# Patient Record
Sex: Male | Born: 1960 | Race: White | Hispanic: No | Marital: Married | Smoking: Never smoker
Health system: Southern US, Community
[De-identification: ages and names within clinical notes are randomized; demographics above are authoritative.]

## PROBLEM LIST (undated history)

## (undated) DIAGNOSIS — Z973 Presence of spectacles and contact lenses: Secondary | ICD-10-CM

## (undated) DIAGNOSIS — Z9989 Dependence on other enabling machines and devices: Secondary | ICD-10-CM

## (undated) DIAGNOSIS — R7303 Prediabetes: Secondary | ICD-10-CM

## (undated) DIAGNOSIS — Z87898 Personal history of other specified conditions: Secondary | ICD-10-CM

## (undated) DIAGNOSIS — I10 Essential (primary) hypertension: Secondary | ICD-10-CM

## (undated) DIAGNOSIS — Z8669 Personal history of other diseases of the nervous system and sense organs: Secondary | ICD-10-CM

## (undated) DIAGNOSIS — G4733 Obstructive sleep apnea (adult) (pediatric): Secondary | ICD-10-CM

## (undated) DIAGNOSIS — C61 Malignant neoplasm of prostate: Secondary | ICD-10-CM

## (undated) HISTORY — DX: Essential (primary) hypertension: I10

## (undated) HISTORY — PX: TONSILLECTOMY: SUR1361

## (undated) HISTORY — PX: PROSTATE BIOPSY: SHX241

---

## 2016-11-08 ENCOUNTER — Other Ambulatory Visit: Payer: Self-pay | Admitting: Urology

## 2016-11-08 DIAGNOSIS — C61 Malignant neoplasm of prostate: Secondary | ICD-10-CM

## 2016-11-10 ENCOUNTER — Encounter (INDEPENDENT_AMBULATORY_CARE_PROVIDER_SITE_OTHER): Payer: Self-pay

## 2016-11-10 ENCOUNTER — Encounter: Payer: Self-pay | Admitting: Cardiology

## 2016-11-10 ENCOUNTER — Ambulatory Visit (INDEPENDENT_AMBULATORY_CARE_PROVIDER_SITE_OTHER): Payer: Managed Care, Other (non HMO) | Admitting: Cardiology

## 2016-11-10 VITALS — BP 112/72 | HR 54 | Ht 71.0 in | Wt 220.2 lb

## 2016-11-10 DIAGNOSIS — I10 Essential (primary) hypertension: Secondary | ICD-10-CM | POA: Diagnosis not present

## 2016-11-10 DIAGNOSIS — R55 Syncope and collapse: Secondary | ICD-10-CM | POA: Diagnosis not present

## 2016-11-10 NOTE — Patient Instructions (Signed)
Medication Instructions:  Your physician recommends that you continue on your current medications as directed. Please refer to the Current Medication list given to you today.  * If you need a refill on your cardiac medications before your next appointment, please call your pharmacy.   Labwork: None ordered  Testing/Procedures: None ordered  Follow-Up: No follow up is needed at this time with Dr. Camnitz.  He will see you on an as needed basis.   Thank you for choosing [facility]!!   Zymier Rodgers, RN [phone]     
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
## 2017-01-28 ENCOUNTER — Ambulatory Visit (HOSPITAL_COMMUNITY)
Admission: RE | Admit: 2017-01-28 | Discharge: 2017-01-28 | Disposition: A | Discharge status: Home / Self Care | Payer: Managed Care, Other (non HMO) | Source: Ambulatory Visit | Attending: Urology | Admitting: Urology

## 2017-02-04 ENCOUNTER — Other Ambulatory Visit (HOSPITAL_COMMUNITY): Payer: Self-pay

## 2017-02-08 ENCOUNTER — Ambulatory Visit (HOSPITAL_COMMUNITY)
Admission: RE | Admit: 2017-02-08 | Discharge: 2017-02-08 | Disposition: A | Discharge status: Home / Self Care | Payer: Managed Care, Other (non HMO) | Source: Ambulatory Visit | Attending: Urology | Admitting: Urology

## 2017-02-08 DIAGNOSIS — C61 Malignant neoplasm of prostate: Secondary | ICD-10-CM | POA: Insufficient documentation

## 2017-02-08 LAB — POCT I-STAT CREATININE: Creatinine, Ser: 1 mg/dL (ref 0.61–1.24)

## 2017-02-08 MED ORDER — LIDOCAINE HCL 2 % EX GEL
CUTANEOUS | Status: AC
  Filled 2017-02-08: qty 30

## 2017-02-08 MED ORDER — GADOBENATE DIMEGLUMINE 529 MG/ML IV SOLN
20.0000 mL | Freq: Once | INTRAVENOUS | Status: AC | PRN
  Administered 2017-02-08: 19 mL via INTRAVENOUS

## 2017-02-21 HISTORY — PX: COLONOSCOPY: SHX174

## 2017-03-16 ENCOUNTER — Encounter: Payer: Self-pay | Admitting: Radiation Oncology

## 2017-03-29 ENCOUNTER — Encounter: Payer: Self-pay | Admitting: Radiation Oncology

## 2017-03-29 DIAGNOSIS — C61 Malignant neoplasm of prostate: Secondary | ICD-10-CM | POA: Insufficient documentation

## 2017-03-29 NOTE — Progress Notes (Signed)
GU Location of Tumor / Histology: prostatic adenocarcinoma  If Prostate Cancer, Gleason Score is (3 + 4) and PSA is (6.85) on 02/08/17. Prostate volume: 25 grams.  Sean Doyle was diagnosed with prostate cancer 03/04/2016. At the time of diagnosis his PSA was 5.53.   Biopsies of prostate (second) (if applicable) revealed:    Past/Anticipated interventions by urology, if any: biopsy, surveillance biopsy, referral to radiation oncology  Past/Anticipated interventions by medical oncology, if any: no  Weight changes, if any: no  Bowel/Bladder complaints, if any: no   Nausea/Vomiting, if any: no  Pain issues, if any:  no  SAFETY ISSUES:  Prior radiation? no  Pacemaker/ICD? no  Possible current pregnancy? no  Is the patient on methotrexate? no  Current Complaints / other details:  56 year old male. Married. Father with hx of prostate ca.

## 2017-03-30 ENCOUNTER — Other Ambulatory Visit: Payer: Self-pay | Admitting: Urology

## 2017-03-30 ENCOUNTER — Ambulatory Visit
Admission: RE | Admit: 2017-03-30 | Discharge: 2017-03-30 | Disposition: A | Discharge status: Home / Self Care | Payer: Managed Care, Other (non HMO) | Source: Ambulatory Visit | Attending: Radiation Oncology | Admitting: Radiation Oncology

## 2017-03-30 ENCOUNTER — Other Ambulatory Visit: Payer: Self-pay

## 2017-03-30 ENCOUNTER — Encounter: Payer: Self-pay | Admitting: Radiation Oncology

## 2017-03-30 ENCOUNTER — Telehealth: Payer: Self-pay | Admitting: *Deleted

## 2017-03-30 VITALS — BP 128/80 | HR 64 | Temp 98.5°F | Wt 228.2 lb

## 2017-03-30 DIAGNOSIS — Z8249 Family history of ischemic heart disease and other diseases of the circulatory system: Secondary | ICD-10-CM | POA: Diagnosis not present

## 2017-03-30 DIAGNOSIS — Z8042 Family history of malignant neoplasm of prostate: Secondary | ICD-10-CM | POA: Diagnosis not present

## 2017-03-30 DIAGNOSIS — Z510 Encounter for antineoplastic radiation therapy: Secondary | ICD-10-CM | POA: Diagnosis not present

## 2017-03-30 DIAGNOSIS — C61 Malignant neoplasm of prostate: Secondary | ICD-10-CM | POA: Insufficient documentation

## 2017-03-30 DIAGNOSIS — I10 Essential (primary) hypertension: Secondary | ICD-10-CM | POA: Insufficient documentation

## 2017-03-30 DIAGNOSIS — Z823 Family history of stroke: Secondary | ICD-10-CM | POA: Insufficient documentation

## 2017-03-30 HISTORY — DX: Malignant neoplasm of prostate: C61
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
## 2017-03-30 NOTE — Telephone Encounter (Signed)
CALLED PATIENT TO INFORM OF PRE-SEED APPTS. FOR 04-08-17, LVM FOR A RETURN CALL

## 2017-03-30 NOTE — Progress Notes (Signed)
GU Location of Tumor / Histology: prostatic adenocarcinoma  If Prostate Cancer, Gleason Score is (3 + 4) and PSA is (6.85) on 02/08/17. Prostate volume: 25 grams.  Trice Aspinall was diagnosed with prostate cancer 03/04/2016. At the time of diagnosis his PSA was 5.53.   Biopsies of prostate (second) (if applicable) revealed:    Past/Anticipated interventions by urology, if any: biopsy, surveillance biopsy, referral to radiation oncology  Past/Anticipated interventions by medical oncology, if any: no  Weight changes, if any: no  Bowel/Bladder complaints, if any: No  Nausea/Vomiting, if any: no  Pain issues, if any:  No  SAFETY ISSUES:  Prior radiation? No  Pacemaker/ICD? No  Possible current pregnancy? no  Is the patient on methotrexate? No  Current Complaints / other details:  56 year old male. Married. Father with hx of prostate ca.  BP 128/80   Pulse 64   Temp 98.5 F (36.9 C) (Oral)   Wt 228 lb 3.2 oz (103.5 kg)   SpO2 98%   BMI 31.83 kg/m   Wt Readings from Last 3 Encounters:  03/30/17 228 lb 3.2 oz (103.5 kg)  11/10/16 220 lb 3.2 oz (99.9 kg)

## 2017-03-31 ENCOUNTER — Other Ambulatory Visit: Payer: Self-pay | Admitting: Urology

## 2017-04-05 ENCOUNTER — Telehealth: Payer: Self-pay | Admitting: *Deleted

## 2017-04-05 NOTE — Telephone Encounter (Signed)
CALLED PATIENT TO INFORM THAT  PRE- SEED APPTS. HAS BEEN MOVED ON 04-08-17 TO 2:30 PM FOR PRE-SEED CT AND 2:45 PM FOR FU VISIT, LVM FOR A RETURN CALL

## 2017-04-07 ENCOUNTER — Telehealth: Payer: Self-pay | Admitting: *Deleted

## 2017-04-07 ENCOUNTER — Telehealth: Payer: Self-pay | Admitting: Medical Oncology

## 2017-04-07 NOTE — Telephone Encounter (Signed)
CALLED PATIENT TO REMIND OF PRE-SEED APPTS. FOR 04-08-17, SPOKE WITH PATIENT AND HE IS AWARE OF THESE APPTS.

## 2017-04-07 NOTE — Telephone Encounter (Signed)
Called to introduce myself to Sean Doyle as the prostate nurse navigator and my role. I was unable to meet him the day he consulted with Dr. Tammi Klippel. He states that he will have seed implant 05/12/17 and is scheduled for CT simulation tomorrow. I will continue to follow and asked him to call with questions or concerns.

## 2017-04-08 ENCOUNTER — Encounter: Payer: Self-pay | Admitting: Medical Oncology

## 2017-04-08 ENCOUNTER — Inpatient Hospital Stay
Admission: RE | Admit: 2017-04-08 | Discharge: 2017-04-08 | Disposition: A | Discharge status: Home / Self Care | Payer: Managed Care, Other (non HMO) | Source: Ambulatory Visit | Attending: Radiation Oncology | Admitting: Radiation Oncology

## 2017-04-08 ENCOUNTER — Ambulatory Visit
Admission: RE | Admit: 2017-04-08 | Discharge: 2017-04-08 | Disposition: A | Discharge status: Home / Self Care | Payer: Managed Care, Other (non HMO) | Source: Ambulatory Visit | Attending: Radiation Oncology | Admitting: Radiation Oncology

## 2017-04-08 DIAGNOSIS — C61 Malignant neoplasm of prostate: Secondary | ICD-10-CM

## 2017-04-08 DIAGNOSIS — Z510 Encounter for antineoplastic radiation therapy: Secondary | ICD-10-CM | POA: Diagnosis not present

## 2017-04-08 NOTE — Progress Notes (Signed)
  Radiation Oncology         [phone]) [phone] ________________________________  Name: Sean Doyle MRN: [id number]  Date: 04/08/2017  DOB: April 18, 1961  SIMULATION AND TREATMENT PLANNING NOTE PUBIC ARCH STUDY  [id number], Abigail Butts, MD  Irine Seal, MD  DIAGNOSIS: 56 y.o. gentleman with Stage T1c adenocarcinoma of the prostate with Gleason Score of 3+4, and PSA of 6.85    ICD-10-CM   1. Malignant neoplasm of prostate ([place]) C61     COMPLEX SIMULATION:  The patient presented today for evaluation for possible prostate seed implant. He was brought to the radiation planning suite and placed supine on the CT couch. A 3-dimensional image study set was obtained in upload to the planning computer. There, on each axial slice, I contoured the prostate gland. Then, using three-dimensional radiation planning tools I reconstructed the prostate in view of the structures from the transperineal needle pathway to assess for possible pubic arch interference. In doing so, I did not appreciate any pubic arch interference. Also, the patient's prostate volume was estimated based on the drawn structure. The volume was 34 cc.  Given the pubic arch appearance and prostate volume, patient remains a good candidate to proceed with prostate seed implant. Today, he freely provided informed written consent to proceed.    PLAN: The patient will undergo prostate seed implant.   ________________________________  Sheral Apley. Tammi Klippel, M.D.  This document serves as a record of services personally performed by Tyler Pita, MD. It was created on his behalf by Rae Lips, a trained medical scribe. The creation of this record is based on the scribe's personal observations and the provider's statements to them. This document has been checked and approved by the attending provider.

## 2017-05-04 ENCOUNTER — Telehealth: Payer: Self-pay | Admitting: *Deleted

## 2017-05-04 NOTE — Telephone Encounter (Signed)
Called patient to remind of labs for procedure on 05-12-17, lab appt. On 05-05-17- arrival time -1:45 pm for 2 pm lab, pt. to report to [facility], spoke with patient and he is aware of this appt.

## 2017-05-05 ENCOUNTER — Encounter (HOSPITAL_COMMUNITY)
Admission: RE | Admit: 2017-05-05 | Discharge: 2017-05-05 | Disposition: A | Discharge status: Home / Self Care | Payer: Managed Care, Other (non HMO) | Source: Ambulatory Visit | Attending: Urology | Admitting: Urology

## 2017-05-05 ENCOUNTER — Ambulatory Visit (HOSPITAL_COMMUNITY)
Admission: RE | Admit: 2017-05-05 | Discharge: 2017-05-05 | Disposition: A | Discharge status: Home / Self Care | Payer: Managed Care, Other (non HMO) | Source: Ambulatory Visit | Attending: Urology | Admitting: Urology

## 2017-05-05 ENCOUNTER — Encounter (INDEPENDENT_AMBULATORY_CARE_PROVIDER_SITE_OTHER): Payer: Self-pay

## 2017-05-05 DIAGNOSIS — Z01812 Encounter for preprocedural laboratory examination: Secondary | ICD-10-CM | POA: Diagnosis present

## 2017-05-05 DIAGNOSIS — Z01818 Encounter for other preprocedural examination: Secondary | ICD-10-CM | POA: Insufficient documentation

## 2017-05-05 DIAGNOSIS — C61 Malignant neoplasm of prostate: Secondary | ICD-10-CM | POA: Diagnosis not present

## 2017-05-05 DIAGNOSIS — Z01810 Encounter for preprocedural cardiovascular examination: Secondary | ICD-10-CM | POA: Insufficient documentation

## 2017-05-05 LAB — COMPREHENSIVE METABOLIC PANEL
ALT: 34 U/L (ref 17–63)
AST: 30 U/L (ref 15–41)
Albumin: 4.1 g/dL (ref 3.5–5.0)
Alkaline Phosphatase: 60 U/L (ref 38–126)
Anion gap: 7 (ref 5–15)
BUN: 22 mg/dL — ABNORMAL HIGH (ref 6–20)
CO2: 30 mmol/L (ref 22–32)
Calcium: 9 mg/dL (ref 8.9–10.3)
Chloride: 102 mmol/L (ref 101–111)
Creatinine, Ser: 0.95 mg/dL (ref 0.61–1.24)
GFR calc Af Amer: >60 mL/min (ref >60)
GFR calc non Af Amer: >60 mL/min (ref >60)
Glucose, Bld: 89 mg/dL (ref 65–99)
Potassium: 4.2 mmol/L (ref 3.5–5.1)
Sodium: 139 mmol/L (ref 135–145)
Total Bilirubin: 0.9 mg/dL (ref 0.3–1.2)
Total Protein: 7.3 g/dL (ref 6.5–8.1)

## 2017-05-05 LAB — APTT: aPTT: 33 seconds (ref 24–36)

## 2017-05-05 LAB — CBC
HCT: 44.4 % (ref 39.0–52.0)
Hemoglobin: 15.3 g/dL (ref 13.0–17.0)
MCH: 31.3 pg (ref 26.0–34.0)
MCHC: 34.5 g/dL (ref 30.0–36.0)
MCV: 90.8 fL (ref 78.0–100.0)
Platelets: 236 10*3/uL (ref 150–400)
RBC: 4.89 MIL/uL (ref 4.22–5.81)
RDW: 12.9 % (ref 11.5–15.5)
WBC: 7.7 10*3/uL (ref 4.0–10.5)

## 2017-05-05 LAB — PROTIME-INR
INR: 1.09
Prothrombin Time: 14 seconds (ref 11.4–15.2)

## 2017-05-09 ENCOUNTER — Encounter (HOSPITAL_BASED_OUTPATIENT_CLINIC_OR_DEPARTMENT_OTHER): Payer: Self-pay | Admitting: *Deleted

## 2017-05-09 ENCOUNTER — Other Ambulatory Visit: Payer: Self-pay

## 2017-05-09 NOTE — Progress Notes (Signed)
SPOKE W/ PT VIA PHONE FOR PRE-OP INTERVIEW.  ARRIVE AT 1000.  CURRENT LAB RESULTS, CXR, AND EKG IN CHART AND Epic.  WILL TAKE METOPROLOL AM DOS W/ SIPS OF WATER AND DO FLEET ENEMA.

## 2017-05-11 ENCOUNTER — Telehealth: Payer: Self-pay | Admitting: *Deleted

## 2017-05-11 NOTE — Telephone Encounter (Signed)
CALLED PATIENT TO REMIND OF PROCEDURE FOR 05-12-17, LVM FOR A RETURN CALL

## 2017-05-11 NOTE — H&P (Signed)
CC: I have prostate cancer.  HPI: Sean Doyle is a 56 year-old male established patient who is here evaluation for treatment of prostate cancer.  His prostate cancer was diagnosed 03/04/2016. He does have the pathology report from his biopsy. His cancer was diagnosed by Dr. Irine Seal. His PSA at his time of diagnosis was 5.53.   He has not undergone surgery for treatment. He has not undergone External Beam Radiation Therapy for treatment. He has not undergone Hormonal Therapy for treatment.   Sean Doyle returns today in f/u from a surveillance biopsy for his history of low risk prostate cancer found on biopsy on on 03/04/16 done for a PSA of 5.53 and a family history of prostate cancer. He had a negative MRIP but his PSA has continued to rise and is up to 6.85. His repeat biopsy showed 2 cores with Gleason 7(3+4) in the right apex and Gleason 6 in the left apex. He has elected a seed implant and has seen Dr. Tammi Klippel. He is scheduled on 05/12/17. He is voiding with some increased urgency but his UA is clear today. His prostate was 25gm on [place]. His IPSS is 4 and he has no ED.   He was previously found to have a 54ml prostate and T1c Nx Mx Gleason 6(3+3) disease in 3 cores. 5% in 2 cores on the right and 10% on a single core on the left.     AUA Symptom Score: He never has the sensation of not emptying his bladder completely after finishing urinating. Less than 20% of the time he has to urinate again fewer than two hours after he has finished urinating. He does not have to stop and start again several times when he urinates. 50% of the time he finds it difficult to postpone urination. He never has a weak urinary stream. He never has to push or strain to begin urination. He never has to get up to urinate from the time he goes to bed until the time he gets up in the morning.   Calculated AUA Symptom Score: 4    ALLERGIES: None   MEDICATIONS: Metoprolol Succinate  Lisinopril-Hydrochlorothiazide     Notes: He is off the HCTZ and on a new diuretic.    GU PSH: Prostate Needle Biopsy - 03/09/2017, 03/04/2016    NON-GU PSH: Surgical Pathology, Gross And Microscopic Examination For Prostate Needle - 03/09/2017, 03/04/2016 Tonsillectomy..    GU PMH: Prostate Cancer - 03/09/2017, His PSA has risen but the MRI was negative. He is due for a surveillance biopsy but will not need MR fusion. I have reviewed the risks of bleeding, infection and voiding difficulty. , - 02/14/2017 (Stable), His PSA is up a little bit but he did have a foley in May. His exam remains benign. I will get his scheduled to return in 3 months with a PSA and a prostate MRI. I will then schedule a biopsy based on that result. Valium given for the MRI. , - 11/08/2016 (Stable), He has a stable PSA and no worrisome symptoms. I am going to continue surveillance and have him return in 3 months with a PSA for an exam. I will consider an MRI and repeat biopsy in the fall unless the PSA rises or the exam changes. , - 07/07/2016, T1c Nx Mx Gleason 6(3+3) prostate cancer that is low to very low risk. The only none very low risk feature is the PSAD. His CAPRA score is 1. , - 03/24/2016 Elevated PSA - 03/04/2016, Elevated PSA  of 5.46 with a negative exam but a family history of prostate cancer. , - 01/07/2016    NON-GU PMH: Encounter for general adult medical examination without abnormal findings, Encounter for preventive health examination Hypertension Sleep Apnea Syncope and collapse    FAMILY HISTORY: Hypertension - Mother, Father Prostate Cancer - Father   SOCIAL HISTORY: Marital Status: Married Preferred Language: English; Ethnicity: Not Hispanic Or Latino; Race: White Current Smoking Status: Patient has never smoked.  Drinks 2 caffeinated drinks per day. Patient's occupation Public librarian.    REVIEW OF SYSTEMS:    GU Review Male:   Patient denies burning/ pain with urination, get up at night to urinate, hard to postpone  urination, leakage of urine, trouble starting your stream, erection problems, stream starts and stops, penile pain, have to strain to urinate , and frequent urination.  Gastrointestinal (Upper):   Patient denies nausea, vomiting, and indigestion/ heartburn.  Gastrointestinal (Lower):   Patient denies diarrhea and constipation.  Constitutional:   Patient denies fever, night sweats, weight loss, and fatigue.  Skin:   Patient denies skin rash/ lesion and itching.  Eyes:   Patient denies blurred vision and double vision.  Ears/ Nose/ Throat:   Patient denies sore throat and sinus problems.  Hematologic/Lymphatic:   Patient denies swollen glands and easy bruising.  Cardiovascular:   Patient denies leg swelling and chest pains.  Respiratory:   Patient reports cough. Patient denies shortness of breath.  Endocrine:   Patient denies excessive thirst.  Musculoskeletal:   Patient denies back pain and joint pain.  Neurological:   Patient denies headaches and dizziness.  Psychologic:   Patient denies depression and anxiety.   VITAL SIGNS:      04/20/2017 09:17 AM  Weight 230 lb / 104.33 kg  Height 71 in / 180.34 cm  BP 147/88 mmHg  Heart Rate 72 /min  BMI 32.1 kg/m   MULTI-SYSTEM PHYSICAL EXAMINATION:    Constitutional: Well-nourished. No physical deformities. Normally developed. Good grooming.  Respiratory: No labored breathing, no use of accessory muscles. CTA  Cardiovascular: Normal temperature, RRR without murmur.      PAST DATA REVIEWED:  Source Of History:  Patient  Records Review:   Pathology Reports  Urine Test Review:   Urinalysis   02/08/17 11/01/16 06/24/16 03/04/16 10/06/15 11/04/14 12/13/13  PSA  Total PSA 6.85 ng/mL 5.71 ng/dl 5.36 ng/dl 5.53  5.46 ng/dl 3.96 ng/dl 4.96 ng/dl    PROCEDURES:          Urinalysis Dipstick Dipstick Cont'd  Color: Yellow Bilirubin: Neg  Appearance: Clear Ketones: Neg  Specific Gravity: 1.025 Blood: Neg  pH: 6.0 Protein: Neg  Glucose: Neg  Urobilinogen: 0.2    Nitrites: Neg    Leukocyte Esterase: Neg    ASSESSMENT:      ICD-10 Details  1 GU:   Prostate Cancer - C61 Worsening - He has T1c Nx Mx intermediate risk prostate cancer and will proceed with a seed implant. I discussed the risks, benefits and rational for Brachytherapy. I reviewed the details of the procedure and the postoperative recovery expectations. I reviewed the risks of bleeding, infection, urinary retention with need for catheter drainage, injury to adjacent structures, severe prolonged LUTS possibly requiring medical or surgical therapy, incontinence, strictures, erectile dysfunction, bowel and bladder radiation injury, thrombotic events, anesthetic complications, urethra-rectal fistulae with need for additional surgery for correction and possible colostomy, mortality and delay radiation effects with a risk of secondary malignancies, treatment failure with need for salvage therapy.  PLAN:           Schedule Return Visit/Planned Activity: Keep Scheduled Appointment - Office Visit             Note: Should have a post op visit scheduled for mid January.           Document Letter(s):  Created for Patient: Clinical Summary

## 2017-05-12 ENCOUNTER — Ambulatory Visit (HOSPITAL_BASED_OUTPATIENT_CLINIC_OR_DEPARTMENT_OTHER)
Admission: RE | Admit: 2017-05-12 | Discharge: 2017-05-12 | Disposition: A | Discharge status: Home / Self Care | Payer: Managed Care, Other (non HMO) | Source: Ambulatory Visit | Attending: Urology | Admitting: Urology

## 2017-05-12 ENCOUNTER — Encounter (HOSPITAL_BASED_OUTPATIENT_CLINIC_OR_DEPARTMENT_OTHER)
Admission: RE | Disposition: A | Discharge status: Home / Self Care | Payer: Self-pay | Source: Ambulatory Visit | Attending: Urology

## 2017-05-12 ENCOUNTER — Ambulatory Visit (HOSPITAL_BASED_OUTPATIENT_CLINIC_OR_DEPARTMENT_OTHER): Payer: Managed Care, Other (non HMO) | Admitting: Anesthesiology

## 2017-05-12 ENCOUNTER — Ambulatory Visit (HOSPITAL_COMMUNITY): Payer: Managed Care, Other (non HMO)

## 2017-05-12 ENCOUNTER — Encounter: Payer: Self-pay | Admitting: Radiation Oncology

## 2017-05-12 ENCOUNTER — Encounter (HOSPITAL_BASED_OUTPATIENT_CLINIC_OR_DEPARTMENT_OTHER): Payer: Self-pay

## 2017-05-12 DIAGNOSIS — Z79899 Other long term (current) drug therapy: Secondary | ICD-10-CM | POA: Diagnosis not present

## 2017-05-12 DIAGNOSIS — C61 Malignant neoplasm of prostate: Secondary | ICD-10-CM | POA: Insufficient documentation

## 2017-05-12 DIAGNOSIS — G4730 Sleep apnea, unspecified: Secondary | ICD-10-CM | POA: Diagnosis not present

## 2017-05-12 DIAGNOSIS — I10 Essential (primary) hypertension: Secondary | ICD-10-CM | POA: Insufficient documentation

## 2017-05-12 HISTORY — PX: RADIOACTIVE SEED IMPLANT: SHX5150

## 2017-05-12 HISTORY — DX: Personal history of other specified conditions: Z87.898

## 2017-05-12 HISTORY — DX: Dependence on other enabling machines and devices: Z99.89

## 2017-05-12 HISTORY — PX: SPACE OAR INSTILLATION: SHX6769

## 2017-05-12 HISTORY — DX: Obstructive sleep apnea (adult) (pediatric): G47.33

## 2017-05-12 HISTORY — DX: Personal history of other diseases of the nervous system and sense organs: Z86.69

## 2017-05-12 HISTORY — PX: CYSTOSCOPY: SHX5120

## 2017-05-12 HISTORY — DX: Presence of spectacles and contact lenses: Z97.3

## 2017-05-12 SURGERY — INSERTION, RADIATION SOURCE, PROSTATE
Anesthesia: General | Site: Rectum

## 2017-05-12 MED ORDER — SODIUM CHLORIDE 0.9 % IV SOLN
250.0000 mL | INTRAVENOUS | Status: DC | PRN
  Filled 2017-05-12: qty 250

## 2017-05-12 MED ORDER — SODIUM CHLORIDE 0.9 % IJ SOLN
INTRAMUSCULAR | Status: DC | PRN
  Administered 2017-05-12: 10 mL

## 2017-05-12 MED ORDER — HYDROCODONE-ACETAMINOPHEN 5-325 MG PO TABS: 1.0000 | ORAL_TABLET | Freq: Four times a day (QID) | ORAL | 0 refills | Status: DC | PRN

## 2017-05-12 MED ORDER — OXYCODONE HCL 5 MG PO TABS
5.0000 mg | ORAL_TABLET | ORAL | Status: DC | PRN
  Filled 2017-05-12: qty 2

## 2017-05-12 MED ORDER — ONDANSETRON HCL 4 MG/2ML IJ SOLN
INTRAMUSCULAR | Status: DC | PRN
  Administered 2017-05-12: 4 mg via INTRAVENOUS

## 2017-05-12 MED ORDER — CIPROFLOXACIN IN D5W 400 MG/200ML IV SOLN
400.0000 mg | INTRAVENOUS | Status: AC
  Administered 2017-05-12: 400 mg via INTRAVENOUS
  Filled 2017-05-12: qty 200

## 2017-05-12 MED ORDER — PROPOFOL 10 MG/ML IV BOLUS
INTRAVENOUS | Status: AC
  Filled 2017-05-12: qty 20

## 2017-05-12 MED ORDER — MIDAZOLAM HCL 2 MG/2ML IJ SOLN
INTRAMUSCULAR | Status: AC
  Filled 2017-05-12: qty 2

## 2017-05-12 MED ORDER — ACETAMINOPHEN 325 MG PO TABS
650.0000 mg | ORAL_TABLET | ORAL | Status: DC | PRN
  Filled 2017-05-12: qty 2

## 2017-05-12 MED ORDER — FENTANYL CITRATE (PF) 100 MCG/2ML IJ SOLN
INTRAMUSCULAR | Status: DC | PRN
  Administered 2017-05-12 (×4): 25 ug via INTRAVENOUS
  Administered 2017-05-12: 50 ug via INTRAVENOUS
  Administered 2017-05-12 (×2): 25 ug via INTRAVENOUS

## 2017-05-12 MED ORDER — FENTANYL CITRATE (PF) 100 MCG/2ML IJ SOLN
INTRAMUSCULAR | Status: AC
  Filled 2017-05-12: qty 2

## 2017-05-12 MED ORDER — FLEET ENEMA 7-19 GM/118ML RE ENEM
1.0000 | ENEMA | Freq: Once | RECTAL | Status: AC
  Administered 2017-05-12: 1 via RECTAL
  Filled 2017-05-12: qty 1

## 2017-05-12 MED ORDER — IOHEXOL 300 MG/ML  SOLN
INTRAMUSCULAR | Status: DC | PRN
  Administered 2017-05-12: 7 mL

## 2017-05-12 MED ORDER — ACETAMINOPHEN 650 MG RE SUPP
650.0000 mg | RECTAL | Status: DC | PRN
  Filled 2017-05-12: qty 1

## 2017-05-12 MED ORDER — FENTANYL CITRATE (PF) 100 MCG/2ML IJ SOLN
25.0000 ug | INTRAMUSCULAR | Status: DC | PRN
  Filled 2017-05-12: qty 1

## 2017-05-12 MED ORDER — LACTATED RINGERS IV SOLN
INTRAVENOUS | Status: DC
  Administered 2017-05-12 (×2): via INTRAVENOUS
  Filled 2017-05-12: qty 1000

## 2017-05-12 MED ORDER — GLYCOPYRROLATE 0.2 MG/ML IV SOSY
PREFILLED_SYRINGE | INTRAVENOUS | Status: AC
  Filled 2017-05-12: qty 5

## 2017-05-12 MED ORDER — PROPOFOL 10 MG/ML IV BOLUS
INTRAVENOUS | Status: DC | PRN
  Administered 2017-05-12: 200 mg via INTRAVENOUS

## 2017-05-12 MED ORDER — SODIUM CHLORIDE 0.9 % IR SOLN
Status: DC | PRN
  Administered 2017-05-12: 1000 mL via INTRAVESICAL

## 2017-05-12 MED ORDER — LIDOCAINE 2% (20 MG/ML) 5 ML SYRINGE
INTRAMUSCULAR | Status: DC | PRN
  Administered 2017-05-12: 80 mg via INTRAVENOUS

## 2017-05-12 MED ORDER — SODIUM CHLORIDE 0.9% FLUSH
3.0000 mL | Freq: Two times a day (BID) | INTRAVENOUS | Status: DC
  Filled 2017-05-12: qty 3

## 2017-05-12 MED ORDER — DEXAMETHASONE SODIUM PHOSPHATE 10 MG/ML IJ SOLN
INTRAMUSCULAR | Status: AC
  Filled 2017-05-12: qty 1

## 2017-05-12 MED ORDER — CIPROFLOXACIN IN D5W 400 MG/200ML IV SOLN
INTRAVENOUS | Status: AC
  Filled 2017-05-12: qty 200

## 2017-05-12 MED ORDER — MORPHINE SULFATE (PF) 2 MG/ML IV SOLN
2.0000 mg | INTRAVENOUS | Status: DC | PRN
  Filled 2017-05-12: qty 1

## 2017-05-12 MED ORDER — SODIUM CHLORIDE 0.9% FLUSH
3.0000 mL | INTRAVENOUS | Status: DC | PRN
  Filled 2017-05-12: qty 3

## 2017-05-12 MED ORDER — ONDANSETRON HCL 4 MG/2ML IJ SOLN
INTRAMUSCULAR | Status: AC
  Filled 2017-05-12: qty 2

## 2017-05-12 MED ORDER — GLYCOPYRROLATE 0.2 MG/ML IV SOSY
PREFILLED_SYRINGE | INTRAVENOUS | Status: DC | PRN
  Administered 2017-05-12: .2 mg via INTRAVENOUS

## 2017-05-12 MED ORDER — DEXAMETHASONE SODIUM PHOSPHATE 10 MG/ML IJ SOLN
INTRAMUSCULAR | Status: DC | PRN
  Administered 2017-05-12: 10 mg via INTRAVENOUS

## 2017-05-12 MED ORDER — LIDOCAINE 2% (20 MG/ML) 5 ML SYRINGE
INTRAMUSCULAR | Status: AC
  Filled 2017-05-12: qty 5

## 2017-05-12 MED ORDER — MIDAZOLAM HCL 2 MG/2ML IJ SOLN
INTRAMUSCULAR | Status: DC | PRN
  Administered 2017-05-12: 2 mg via INTRAVENOUS

## 2017-05-12 SURGICAL SUPPLY — 31 items
BAG URINE DRAINAGE (UROLOGICAL SUPPLIES) ×5 IMPLANT
BLADE CLIPPER SURG (BLADE) ×5 IMPLANT
CATH FOLEY 2WAY SLVR  5CC 16FR (CATHETERS) ×2
CATH FOLEY 2WAY SLVR 5CC 16FR (CATHETERS) ×3 IMPLANT
CATH ROBINSON RED A/P 16FR (CATHETERS) IMPLANT
CATH ROBINSON RED A/P 20FR (CATHETERS) ×5 IMPLANT
CLOTH BEACON ORANGE TIMEOUT ST (SAFETY) ×5 IMPLANT
COVER BACK TABLE 60X90IN (DRAPES) ×5 IMPLANT
COVER MAYO STAND STRL (DRAPES) ×5 IMPLANT
DRSG TEGADERM 4X4.75 (GAUZE/BANDAGES/DRESSINGS) ×5 IMPLANT
DRSG TEGADERM 8X12 (GAUZE/BANDAGES/DRESSINGS) ×5 IMPLANT
GAUZE SPONGE 4X4 12PLY STRL LF (GAUZE/BANDAGES/DRESSINGS) ×5 IMPLANT
GLOVE ECLIPSE 8.0 STRL XLNG CF (GLOVE) ×15 IMPLANT
GLOVE SURG SS PI 8.0 STRL IVOR (GLOVE) ×5 IMPLANT
GOWN STRL REUS W/ TWL XL LVL3 (GOWN DISPOSABLE) ×3 IMPLANT
GOWN STRL REUS W/TWL XL LVL3 (GOWN DISPOSABLE) ×7 IMPLANT
HOLDER FOLEY CATH W/STRAP (MISCELLANEOUS) IMPLANT
IMPL SPACEOAR SYSTEM 10ML (MISCELLANEOUS) ×3 IMPLANT
IMPLANT SPACEOAR SYSTEM 10ML (MISCELLANEOUS) ×5
IV NS 1000ML (IV SOLUTION) ×2
IV NS 1000ML BAXH (IV SOLUTION) ×3 IMPLANT
KIT RM TURNOVER CYSTO AR (KITS) ×5 IMPLANT
PACK CYSTO (CUSTOM PROCEDURE TRAY) ×5 IMPLANT
SURGILUBE 2OZ TUBE FLIPTOP (MISCELLANEOUS) ×5 IMPLANT
SUT BONE WAX W31G (SUTURE) ×5 IMPLANT
SYRINGE 10CC LL (SYRINGE) ×5 IMPLANT
UNDERPAD 30X30 (UNDERPADS AND DIAPERS) ×10 IMPLANT
UNDERPAD 30X30 INCONTINENT (UNDERPADS AND DIAPERS) ×10 IMPLANT
WATER STERILE IRR 3000ML UROMA (IV SOLUTION) IMPLANT
WATER STERILE IRR 500ML POUR (IV SOLUTION) ×5 IMPLANT
selectSeed 1-125 ×5 IMPLANT

## 2017-05-12 NOTE — Op Note (Signed)
PATIENT:  Sean Doyle  PRE-OPERATIVE DIAGNOSIS:  Adenocarcinoma of the prostate  POST-OPERATIVE DIAGNOSIS:  Same  PROCEDURE:  Procedure(s): 1. I-125 radioactive seed implantation 2. Cystoscopy  SURGEON:  Surgeon(s): Irine Seal MD  Radiation oncologist: Dr. Tyler Pita  ANESTHESIA:  General  EBL:  Minimal  DRAINS: 61 French Foley catheter  INDICATION: [name] is a 56 y.o. with Stage T1c, Gleason 7(3+4)  prostate cancer who has elected brachytherapy for treatment.  Description of procedure: After informed consent the patient was brought to the major OR, placed on the table and administered general anesthesia. He was then moved to the modified lithotomy position with his perineum perpendicular to the floor. His perineum and genitalia were then sterilely prepped. An official timeout was then performed. A 16 French Foley catheter was then placed in the bladder and filled with dilute contrast, a rectal tube was placed in the rectum and the transrectal ultrasound probe was placed in the rectum and affixed to the stand. He was then sterilely draped.  The sterile grid was installed.   Anchor needles were then placed.   Real time ultrasonography was used along with the seed planning software spot-pro version 3.1-00. This was used to develop the seed plan including the number of needles as well as number of seeds required for complete and adequate coverage. Real-time ultrasonography was then used along with the previously developed plan and the Nucletron device to implant a total of 75 seeds using 21 needles for a target dose of 145 Gy. This proceeded without difficulty or complication.  After completion of the seed implant the space or gel was injected.  Initially the needle was passed approximately 3 cm anterior to the rectum in the midline and guided ultrasonically into the plane between the prostate and the rectum.  The location was tested by aspirating the syringe and then instilling  a small amount of sterile saline.  Positioning was excellent.  The space or components were then injected over a 10-second interval with excellent distribution.  The needle was removed.  A Foley catheter was then removed as well as the transrectal ultrasound probe and rectal probe. Flexible cystoscopy was then performed using the 17 French flexible scope which revealed a normal urethra throughout its length down to the sphincter which appeared intact.  There was a small amount of blood in the urethra and prostatic urethra but no active bleeding was noted.  The prostatic urethra was short with bilobar hyperplasia and mild obstruction.   The bladder was then entered and fully and systematically inspected.  The ureteral orifices were noted to be of normal configuration and position. The mucosa revealed no evidence of tumors. There were also no stones identified within the bladder.  No seeds or spacers were seen and/or removed from the bladder.  The cystoscope was then removed.  The drapes were removed.  The perineum was cleaned and dressed.  He was taken out of the lithotomy position and was awakened and taken to recovery room in stable and satisfactory condition. He tolerated procedure well and there were no intraoperative complications.

## 2017-05-12 NOTE — Transfer of Care (Signed)
Immediate Anesthesia Transfer of Care Note  Patient: Sean Doyle  Procedure(s) Performed: Procedure(s) (LRB): RADIOACTIVE SEED IMPLANT/BRACHYTHERAPY IMPLANT WITH SPACE OAR (N/A) SPACE OAR INSTILLATION (N/A) CYSTOSCOPY (N/A)  Patient Location: PACU  Anesthesia Type: General  Level of Consciousness: awake, oriented, sedated and patient cooperative  Airway & Oxygen Therapy: Patient Spontanous Breathing and Patient connected to face mask oxygen  Post-op Assessment: Report given to PACU RN and Post -op Vital signs reviewed and stable  Post vital signs: Reviewed and stable  Complications: No apparent anesthesia complications  Last Vitals:  Vitals:   05/12/17 1415 05/12/17 1417  BP: 120/84   Pulse: 74   Resp: (!) 8 18  Temp: 37.1 C   SpO2: 95%     Last Pain:  Vitals:   05/12/17 1001  TempSrc: Oral      Patients Stated Pain Goal: 5 (05/12/17 1040)

## 2017-05-12 NOTE — Interval H&P Note (Signed)
History and Physical Interval Note:  05/12/2017 11:22 AM  Sean Doyle  has presented today for surgery, with the diagnosis of PROSTATE CANCER  The various methods of treatment have been discussed with the patient and family. After consideration of risks, benefits and other options for treatment, the patient has consented to  Procedure(s): RADIOACTIVE SEED IMPLANT/BRACHYTHERAPY IMPLANT WITH SPACE OAR (N/A) SPACE OAR INSTILLATION (N/A) as a surgical intervention .  The patient's history has been reviewed, patient examined, no change in status, stable for surgery.  I have reviewed the patient's chart and labs.  Questions were answered to the patient's satisfaction.     Irine Seal

## 2017-05-12 NOTE — Discharge Instructions (Addendum)
Post Anesthesia Home Care Instructions  Activity: Get plenty of rest for the remainder of the day. A responsible individual must stay with you for 24 hours following the procedure.  For the next 24 hours, DO NOT: -Drive a car -Paediatric nurse -Drink alcoholic beverages -Take any medication unless instructed by your physician -Make any legal decisions or sign important papers.  Meals: Start with liquid foods such as gelatin or soup. Progress to regular foods as tolerated. Avoid greasy, spicy, heavy foods. If nausea and/or vomiting occur, drink only clear liquids until the nausea and/or vomiting subsides. Call your physician if vomiting continues.  Special Instructions/Symptoms: Your throat may feel dry or sore from the anesthesia or the breathing tube placed in your throat during surgery. If this causes discomfort, gargle with warm salt water. The discomfort should disappear within 24 hours.  Brachytherapy for Prostate Cancer, Care After Refer to this sheet in the next few weeks. These instructions provide you with information on caring for yourself after your procedure. Your health care provider may also give you more specific instructions. Your treatment has been planned according to current medical practices, but problems sometimes occur. Call your health care provider if you have any problems or questions after your procedure. What can I expect after the procedure? The area behind the scrotum will probably be tender and bruised. For a short period of time you may have:  Difficulty passing urine. You may need a catheter for a few days to a month.  Blood in the urine or semen.  A feeling of constipation because of prostate swelling.  Frequent feeling of an urgent need to urinate.  For a long period of time you may have:  Inflammation of the rectum. This happens in about 2% of people who have the procedure.  Erection problems. These vary with age and occur in about 15-40% of  men.  Difficulty urinating. This is caused by scarring in the urethra.  Diarrhea.  Follow these instructions at home:  Take medicines only as directed by your health care provider.  Keep all follow-up visits as directed by your health care provider. If you have a catheter, it will be removed during one of these visits.  Try not to sit directly on the area behind the scrotum. A soft cushion can decrease the discomfort. Ice packs may also be helpful for the discomfort. Do not put ice directly on the skin.  Shower and wash the area behind the scrotum gently. Do not sit in a tub.  If you have had the brachytherapy that uses the seeds, limit your close contact with children and pregnant women for 2 months because of the radiation still in the prostate. After that period of time, the levels drop off quickly. Get help right away if:  You have a fever.  You have chills.  You have shortness of breath.  You have chest pain.  You have thick blood, like tomato juice,in the urine  There is excessive bleeding from your rectum. It is normal to have a little blood mixed with your stool.  There is severe discomfort in the treated area that does not go away with pain medicine.  You have abdominal discomfort.  You have severe nausea or vomiting.  You develop any new or unusual symptoms. This information is not intended to replace advice given to you by your health care provider. Make sure you discuss any questions you have with your health care provider. Document Released: 06/12/2010 Document Revised: 10/22/2015 Document Reviewed:  10/31/2012 Elsevier Interactive Patient Education  2017 [employer].

## 2017-05-12 NOTE — Anesthesia Procedure Notes (Signed)
Procedure Name: LMA Insertion Date/Time: 05/12/2017 12:40 PM Performed by: Suan Halter, CRNA Pre-anesthesia Checklist: Patient identified, Emergency Drugs available, Suction available and Patient being monitored Patient Re-evaluated:Patient Re-evaluated prior to induction Oxygen Delivery Method: Circle system utilized Preoxygenation: Pre-oxygenation with 100% oxygen Induction Type: IV induction Ventilation: Mask ventilation without difficulty LMA: LMA inserted LMA Size: 4.0 Number of attempts: 1 Airway Equipment and Method: Bite block Placement Confirmation: positive ETCO2 Tube secured with: Tape Dental Injury: Teeth and Oropharynx as per pre-operative assessment

## 2017-05-12 NOTE — Anesthesia Preprocedure Evaluation (Signed)
Anesthesia Evaluation  Patient identified by MRN, date of birth, ID band Patient awake    Reviewed: Allergy & Precautions, NPO status , Patient's Chart, lab work & pertinent test results  Airway Mallampati: II  TM Distance: >3 FB     Dental   Pulmonary sleep apnea ,    breath sounds clear to auscultation       Cardiovascular hypertension,  Rhythm:Regular Rate:Normal     Neuro/Psych    GI/Hepatic negative GI ROS, Neg liver ROS,   Endo/Other  negative endocrine ROS  Renal/GU negative Renal ROS     Musculoskeletal   Abdominal   Peds  Hematology   Anesthesia Other Findings   Reproductive/Obstetrics                             Anesthesia Physical Anesthesia Plan  ASA: III  Anesthesia Plan: General   Post-op Pain Management:    Induction: Intravenous  PONV Risk Score and Plan: 2 and Treatment may vary due to age or medical condition, Midazolam, Ondansetron and Dexamethasone  Airway Management Planned:   Additional Equipment:   Intra-op Plan:   Post-operative Plan: Extubation in OR  Informed Consent: I have reviewed the patients History and Physical, chart, labs and discussed the procedure including the risks, benefits and alternatives for the proposed anesthesia with the patient or authorized representative who has indicated his/her understanding and acceptance.   Dental advisory given  Plan Discussed with: CRNA and Anesthesiologist  Anesthesia Plan Comments:         Anesthesia Quick Evaluation

## 2017-05-13 ENCOUNTER — Encounter (HOSPITAL_BASED_OUTPATIENT_CLINIC_OR_DEPARTMENT_OTHER): Payer: Self-pay | Admitting: Urology

## 2017-05-13 NOTE — Anesthesia Postprocedure Evaluation (Signed)
Anesthesia Post Note  Patient: Sean Doyle  Procedure(s) Performed: RADIOACTIVE SEED IMPLANT/BRACHYTHERAPY IMPLANT WITH SPACE OAR (N/A Prostate) SPACE OAR INSTILLATION (N/A Rectum) CYSTOSCOPY (N/A Bladder)     Patient location during evaluation: PACU Anesthesia Type: General Level of consciousness: awake and alert Pain management: pain level controlled Vital Signs Assessment: post-procedure vital signs reviewed and stable Respiratory status: spontaneous breathing, nonlabored ventilation, respiratory function stable and patient connected to nasal cannula oxygen Cardiovascular status: blood pressure returned to baseline and stable Postop Assessment: no apparent nausea or vomiting Anesthetic complications: no    Last Vitals:  Vitals:   05/12/17 1500 05/12/17 1623  BP: 119/81 134/80  Pulse: 69 (!) 49  Resp: 13   Temp:  36.7 C  SpO2: 99% 96%    Last Pain:  Vitals:   05/12/17 1415  TempSrc:   PainSc: Asleep                 Keya Wynes S

## 2017-05-15 NOTE — Progress Notes (Signed)
  Radiation Oncology         [phone] ________________________________  Name: Sean Doyle MRN: [id number]  Date: 05/15/2017  DOB: [birth year]       Prostate Seed Implant  MW:[id number], Abigail Butts, MD  No ref. provider found  DIAGNOSIS: 56 y.o. gentleman with Stage T1c adenocarcinoma of the prostate with Gleason Score of 3+4, and PSA of 6.85  PROCEDURE: Insertion of radioactive I-125 seeds into the prostate gland.  RADIATION DOSE: 145 Gy, definitive  TECHNIQUE: Sean Doyle was brought to the operating room with the urologist. He was placed in the dorsolithotomy position. He was catheterized and a rectal tube was inserted. The perineum was shaved, prepped and draped. The ultrasound probe was then introduced into the rectum to see the prostate gland.  TREATMENT DEVICE: A needle grid was attached to the ultrasound probe stand and anchor needles were placed.  3D PLANNING: The prostate was imaged in 3D using a sagittal sweep of the prostate probe. These images were transferred to the planning computer. There, the prostate, urethra and rectum were defined on each axial reconstructed image. Then, the software created an optimized 3D plan and a few seed positions were adjusted. The quality of the plan was reviewed using [facility] information for the target and the following two organs at risk:  Urethra and Rectum.  Then the accepted plan was uploaded to the seed Selectron afterloading unit.  PROSTATE VOLUME STUDY:  Using transrectal ultrasound the volume of the prostate was verified to be 41.4 cc.  SPECIAL TREATMENT PROCEDURE/SUPERVISION AND HANDLING: The Nucletron FIRST system was used to place the needles under sagittal guidance. A total of 21 needles were used to deposit 75 seeds in the prostate gland. The individual seed activity was 0.427 mCi.  SpaceOAR:  Yes  COMPLEX SIMULATION: At the end of the procedure, an anterior radiograph of the pelvis was obtained to document seed positioning and count.  Cystoscopy was performed to check the urethra and bladder.  MICRODOSIMETRY: At the end of the procedure, the patient was emitting 0.067 mR/hr at 1 meter. Accordingly, he was considered safe for hospital discharge.  PLAN: The patient will return to the radiation oncology clinic for post implant CT dosimetry in three weeks.   ________________________________  Sheral Apley Tammi Klippel, M.D.

## 2017-05-26 ENCOUNTER — Telehealth: Payer: Self-pay | Admitting: *Deleted

## 2017-05-26 NOTE — Telephone Encounter (Signed)
CALLED PATIENT TO REMIND OF POST SEED APPTS. FOR 05-27-17, SPOKE WITH PATIENT AND HE IS AWARE OF THESE APPTS.

## 2017-05-27 ENCOUNTER — Encounter: Payer: Self-pay | Admitting: Radiation Oncology

## 2017-05-27 ENCOUNTER — Encounter: Payer: Self-pay | Admitting: Medical Oncology

## 2017-05-27 ENCOUNTER — Ambulatory Visit
Admission: RE | Admit: 2017-05-27 | Discharge: 2017-05-27 | Disposition: A | Discharge status: Home / Self Care | Payer: Managed Care, Other (non HMO) | Source: Ambulatory Visit | Attending: Radiation Oncology | Admitting: Radiation Oncology

## 2017-05-27 ENCOUNTER — Other Ambulatory Visit: Payer: Self-pay

## 2017-05-27 VITALS — BP 134/90 | HR 77 | Temp 98.0°F | Resp 16

## 2017-05-27 DIAGNOSIS — Z510 Encounter for antineoplastic radiation therapy: Secondary | ICD-10-CM | POA: Diagnosis not present

## 2017-05-27 DIAGNOSIS — C61 Malignant neoplasm of prostate: Secondary | ICD-10-CM

## 2017-05-27 NOTE — Progress Notes (Signed)
  Radiation Oncology         [phone] ________________________________  Name: Sean Doyle MRN: [id number]  Date: 05/27/2017  DOB: 16-Apr-1961  COMPLEX SIMULATION NOTE  NARRATIVE:  The patient was brought to the [facility] today following prostate seed implantation approximately one month ago.  Identity was confirmed.  All relevant records and images related to the planned course of therapy were reviewed.  Then, the patient was set-up supine.  CT images were obtained.  The CT images were loaded into the planning software.  Then the prostate and rectum were contoured.  Treatment planning then occurred.  The implanted iodine 125 seeds were identified by the physics staff for projection of radiation distribution  I have requested : 3D Simulation  I have requested a DVH of the following structures: Prostate and rectum.    ________________________________  Sheral Apley Tammi Klippel, M.D.

## 2017-05-27 NOTE — Progress Notes (Signed)
Radiation Oncology         ([phone]) [phone] ________________________________  Name: Sean Doyle MRN: [id number]  Date: 05/27/2017  DOB: February 08, 1961  Follow-Up Visit Note  CC: Cari Caraway, MD  Irine Seal, MD  Diagnosis:   57 y.o. 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 Malignant neoplasm of prostate ([place]) C61     Interval Since Last Radiation:  2 weeks  05/12/2017  Insertion of SpaceOAR and radioactive I-125 seeds into the prostate gland; 145 Gy, definitive  Narrative:  The patient returns today for routine follow-up.  He is complaining of increased urinary frequency and urinary hesitation symptoms. He filled out a questionnaire regarding urinary function today providing and overall IPSS score of 17 characterizing his symptoms as moderate.  His pre-implant score was 1. His biggest complaint is urgency and frequency with weak flow of stream and feelings of incomplete emptying on occasion.  Overall, he feels he is emptying his bladder well on voiding and denies dysuria, gross hematuria, or incontinence. He reports nocturia x1/night.  He has a prescription for Flomax but has not felt the need to take this.  He denies any bowel symptoms and reports a healthy appetite.  He has not noted any significant impact on his energy level.  ALLERGIES:  has No Known Allergies.  Meds: Current Outpatient Medications  Medication Sig Dispense Refill  . lisinopril-hydrochlorothiazide (PRINZIDE,ZESTORETIC) 10-12.5 MG tablet Take 1 tablet by mouth every morning.     . metoprolol tartrate (LOPRESSOR) 25 MG tablet Take 37.5 mg by mouth every morning. Take one and a half tablets (37.5 mg) by mouth daily      No current facility-administered medications for this encounter.     Physical Findings:  oral temperature is 98 F (36.7 C). His blood pressure is 134/90 and his pulse is 77. His respiration is 16 and oxygen saturation is 100%.  .   On review of systems, the patient reports that he is doing well overall. He denies any chest pain, shortness of breath, cough, fevers, chills, night sweats, unintended weight changes. LUTS as described above in HPI.  He denies any bowel disturbances, and denies abdominal pain, nausea or vomiting. He denies any new musculoskeletal or joint aches or pains, new skin lesions or concerns. A complete review of systems is obtained and is otherwise negative.  Lab Findings: Lab Results  Component Value Date   WBC 7.7 05/05/2017   HGB 15.3 05/05/2017   HCT 44.4 05/05/2017   MCV 90.8 05/05/2017   PLT 236 05/05/2017    Radiographic Findings:  Patient underwent CT imaging in our clinic for post implant dosimetry. The CT was personally reviewed by Dr. Tammi Klippel and appears to demonstrate an adequate distribution of radioactive seeds throughout the prostate gland. There are no seeds in orr near the rectum. We are awaiting insurance approval of his prostate MRI for verification of SpaceOAR distribution and anticipate this will be completed within the next week.  Once we have the MRI, these images will be fused with the CT images for further evaluation and we suspect the final radiation plan and dosimetry will show appropriate coverage of the prostate gland.   Impression: The patient is recovering from the effects of radiation. His urinary symptoms should gradually improve over the next 4-6 months. We talked about this today. He is encouraged by his improvement already and is otherwise pleased with his outcome.   Plan:  Today, Dr. Tammi Klippel and I spent time talking to the patient about his prostate seed implant and resolving urinary symptoms. We also talked about long-term follow-up for prostate cancer following seed implant. He has a scheduled follow up with Dr. Jeffie Pollock on 06/02/17. He understands that ongoing PSA determinations and digital rectal exams will help perform surveillance to rule out disease recurrence. He  understands what to expect with his PSA measures. Patient was also educated today about some of the long-term effects from radiation including a small risk for rectal bleeding and possibly erectile dysfunction. We talked about some of the general management approaches to these potential complications. However, we did encourage the patient to contact our office or return at any point if he has questions or concerns related to his previous radiation and prostate cancer.   Nicholos Johns, PA-C    Tyler Pita, MD  [facility] Direct Dial: [phone]  Fax: [phone] [email or web address]  Skype  [email or web address]   This document serves as a record of services personally performed by Tyler Pita, MD and Freeman Caldron, PA-C. It was created on their behalf by Arlyce Harman, a trained medical scribe. The creation of this record is based on the scribe's personal observations and the provider's statements to them. This document has been checked and approved by the attending provider.

## 2017-05-27 NOTE — Progress Notes (Signed)
Patient returns for follow up post seed appointment. Weight and vitals stable. Pre Seed IPSS 1. Post seed IPSS 17. Denies dysuria, hematuria, leakage or incontinence. Reports urgency is the most problematic symptom he experience. Denies taking flomax. Scheduled to follow up with urologist on January 10th. MRI to confirm spaceoar placement requires peer to peer before scheduling can occur.   BP 134/90 (BP Location: Left Arm, Patient Position: Sitting, Cuff Size: Normal)   Pulse 77   Temp 98 F (36.7 C) (Oral)   Resp 16   SpO2 100%  Wt Readings from Last 3 Encounters:  05/12/17 225 lb 1.6 oz (102.1 kg)  03/30/17 228 lb 3.2 oz (103.5 kg)  11/10/16 220 lb 3.2 oz (99.9 kg)

## 2017-07-15 ENCOUNTER — Ambulatory Visit: Payer: Managed Care, Other (non HMO) | Attending: Radiation Oncology | Admitting: Radiation Oncology

## 2017-07-15 DIAGNOSIS — Z510 Encounter for antineoplastic radiation therapy: Secondary | ICD-10-CM | POA: Insufficient documentation

## 2017-07-15 DIAGNOSIS — C61 Malignant neoplasm of prostate: Secondary | ICD-10-CM | POA: Insufficient documentation

## 2017-07-16 NOTE — Progress Notes (Signed)
  Radiation Oncology         ([phone]) [phone] ________________________________  Name: Sean Doyle MRN: [id number]  Date: 05/12/2017  DOB: 1961-03-21  3D Planning Note   Prostate Brachytherapy Post-Implant Dosimetry  Diagnosis: 57 year-old gentleman with Stage T1c adenocarcinoma of the prostate with Gleason Score of 3+4, and PSA of 6.85.  Narrative: On a previous date, Sean Doyle returned following prostate seed implantation for post implant planning. He underwent CT scan complex simulation to delineate the three-dimensional structures of the pelvis and demonstrate the radiation distribution.  Since that time, the seed localization, and complex isodose planning with dose volume histograms have now been completed.  Results:   Prostate Coverage - The dose of radiation delivered to the 90% or more of the prostate gland (D90) was 94.08% of the prescription dose. This exceeds our goal of greater than 90%. Rectal Sparing - The volume of rectal tissue receiving the prescription dose or higher was 0.0 cc. This falls under our thresholds tolerance of 1.0 cc.  Impression: The prostate seed implant appears to show adequate target coverage and appropriate rectal sparing.  Plan:  The patient will continue to follow with urology for ongoing PSA determinations. I would anticipate a high likelihood for local tumor control with minimal risk for rectal morbidity.  ________________________________  Sheral Apley Tammi Klippel, M.D.

## 2017-07-26 NOTE — Addendum Note (Signed)
Encounter addended by: Heywood Footman, RN on: 07/26/2017 3:17 PM  Actions taken: Visit Navigator Flowsheet section accepted

## 2018-01-04 IMAGING — DX DG CHEST 2V
2 series · 2 of 2 positions shown · non-contrast
Comparison: 10/28/2016

CLINICAL DATA: History of prostate carcinoma

EXAM:
CHEST  2 VIEW

[chest pa]
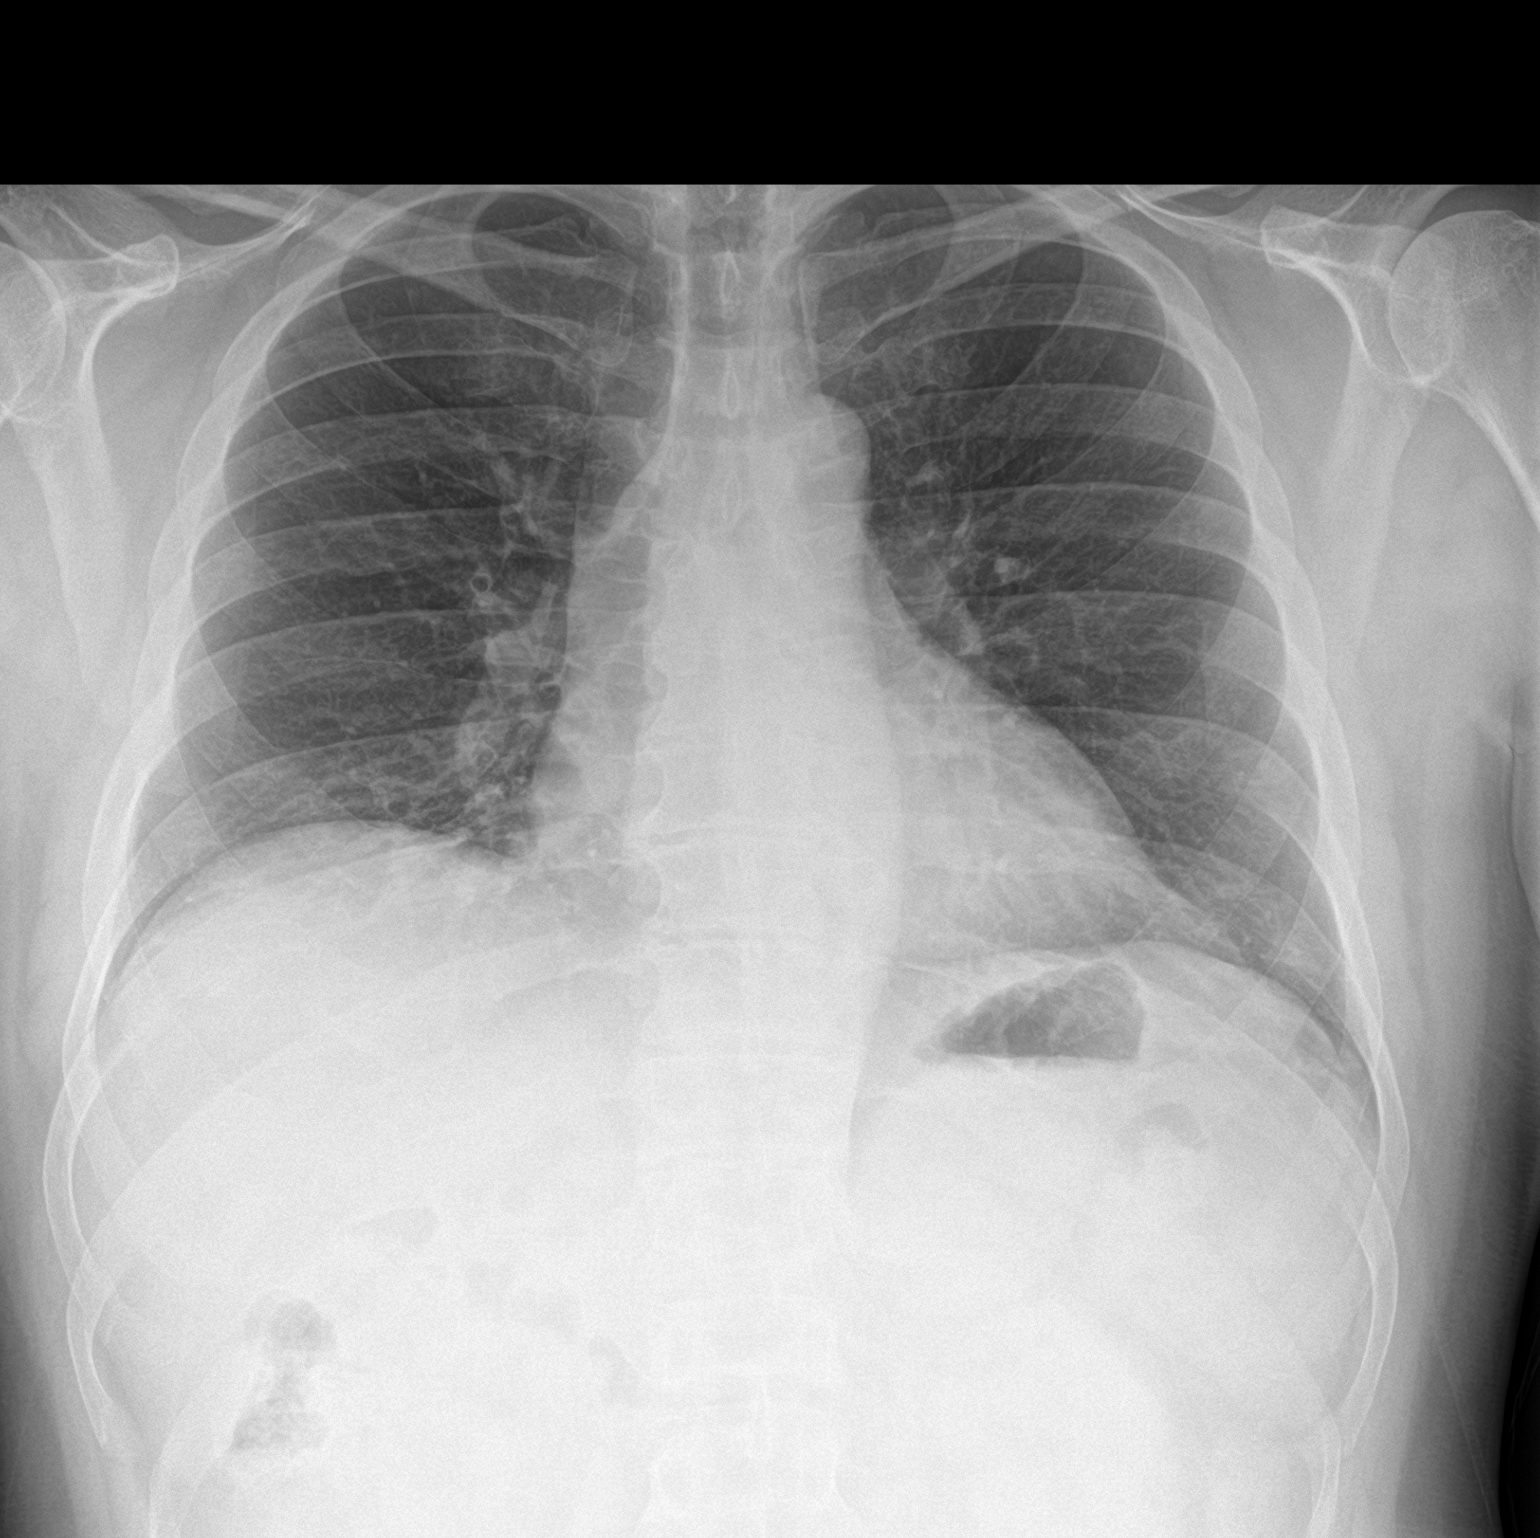

[chest lat]
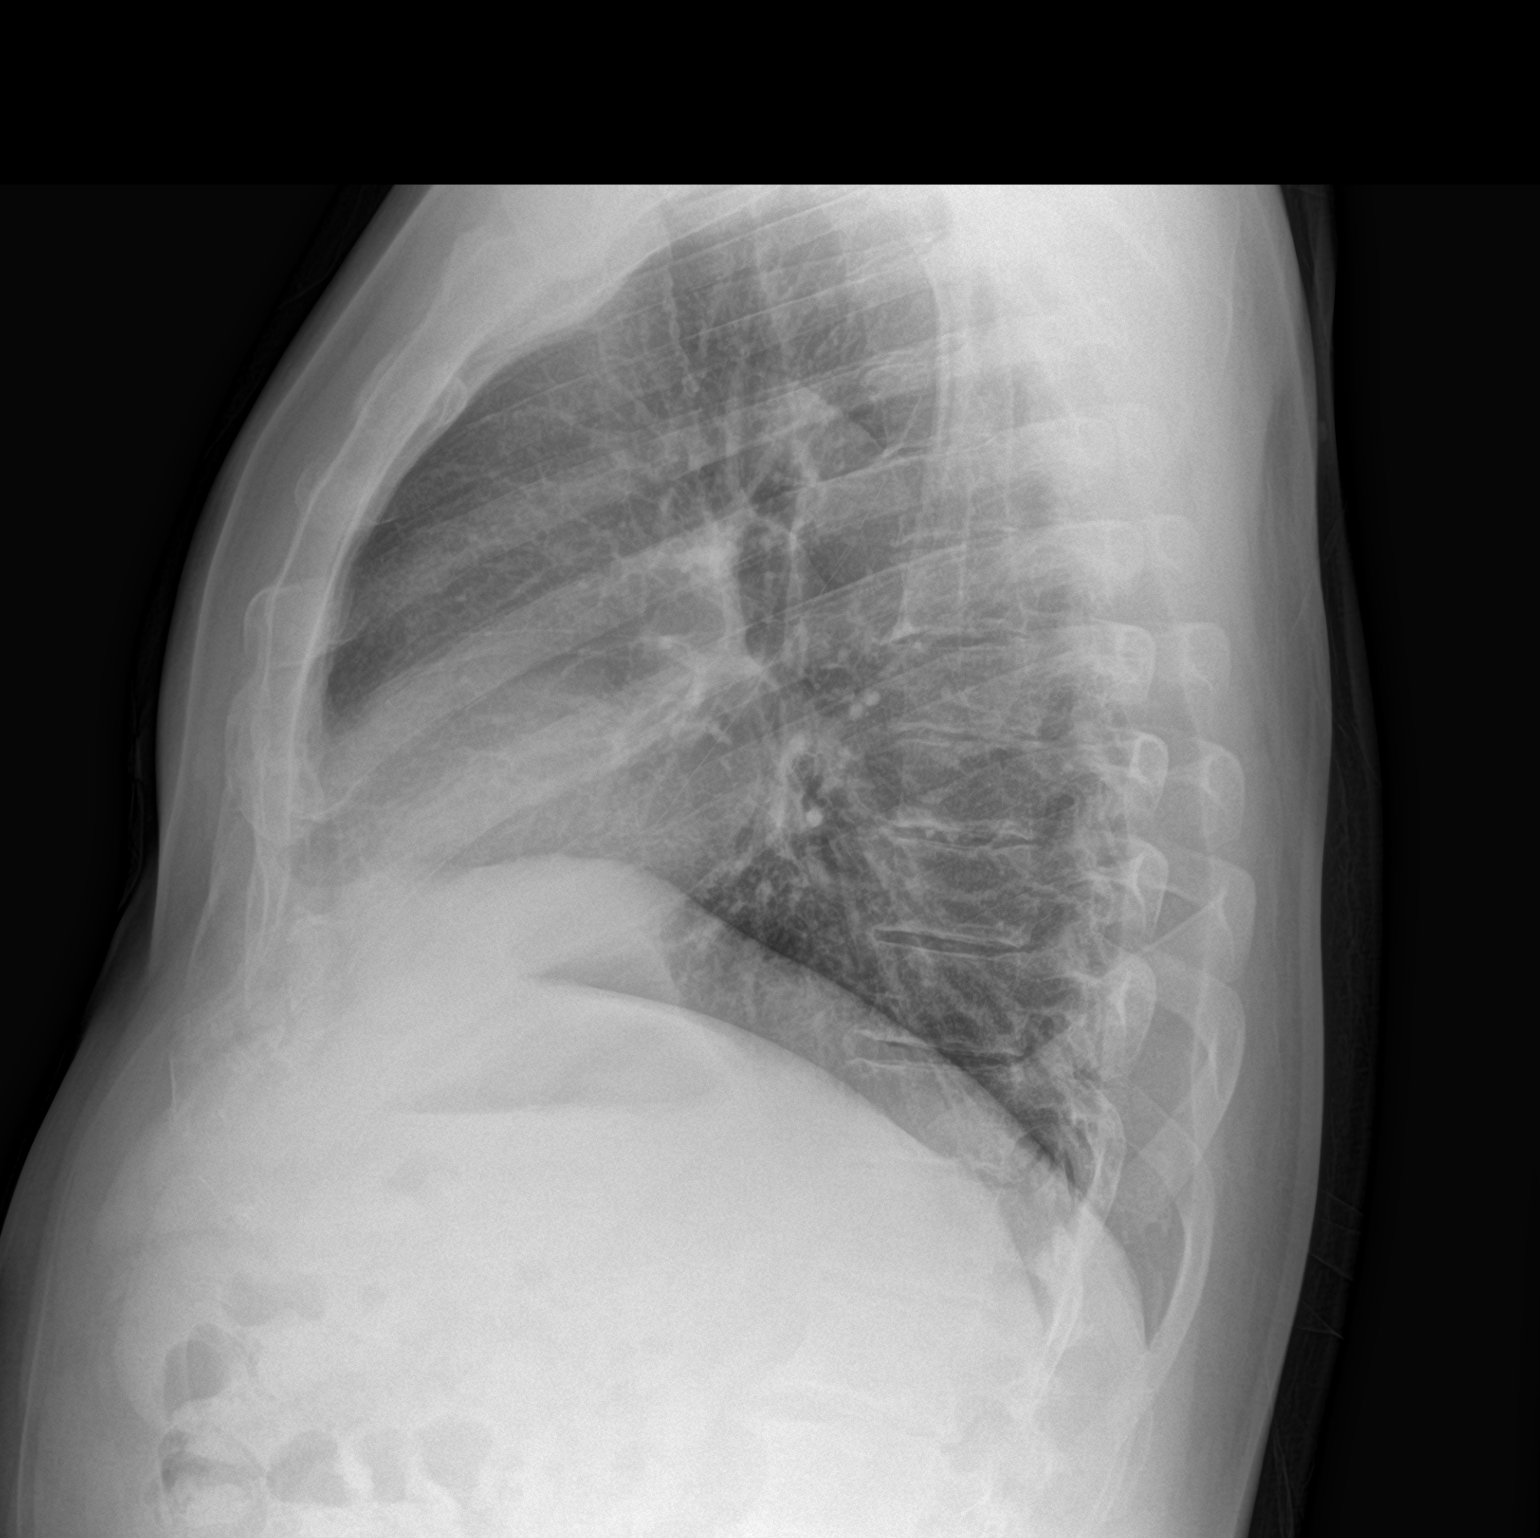

[2 of 2 positions shown; findings below may reference images not displayed]

FINDINGS: The heart size and mediastinal contours are within normal limits.
Both lungs are clear. The visualized skeletal structures are
unremarkable.
IMPRESSION: No active cardiopulmonary disease.

## 2018-08-14 DIAGNOSIS — M5442 Lumbago with sciatica, left side: Secondary | ICD-10-CM | POA: Diagnosis not present

## 2019-02-21 DIAGNOSIS — G4733 Obstructive sleep apnea (adult) (pediatric): Secondary | ICD-10-CM | POA: Diagnosis not present

## 2019-04-27 ENCOUNTER — Other Ambulatory Visit: Payer: Self-pay

## 2019-04-27 DIAGNOSIS — Z20822 Contact with and (suspected) exposure to covid-19: Secondary | ICD-10-CM

## 2019-04-29 LAB — NOVEL CORONAVIRUS, NAA: SARS-CoV-2, NAA: NOT DETECTED

## 2019-04-30 ENCOUNTER — Telehealth: Payer: Self-pay

## 2019-04-30 NOTE — Telephone Encounter (Signed)
Patient given negative result and verbalized understanding  

## 2019-08-01 DIAGNOSIS — R3915 Urgency of urination: Secondary | ICD-10-CM | POA: Diagnosis not present

## 2019-08-01 DIAGNOSIS — Z8546 Personal history of malignant neoplasm of prostate: Secondary | ICD-10-CM | POA: Diagnosis not present

## 2019-09-10 DIAGNOSIS — G4733 Obstructive sleep apnea (adult) (pediatric): Secondary | ICD-10-CM | POA: Diagnosis not present

## 2019-09-27 DIAGNOSIS — I10 Essential (primary) hypertension: Secondary | ICD-10-CM | POA: Diagnosis not present

## 2019-09-27 DIAGNOSIS — G4733 Obstructive sleep apnea (adult) (pediatric): Secondary | ICD-10-CM | POA: Diagnosis not present

## 2019-09-27 DIAGNOSIS — Z86010 Personal history of colonic polyps: Secondary | ICD-10-CM | POA: Diagnosis not present

## 2019-09-27 DIAGNOSIS — E7800 Pure hypercholesterolemia, unspecified: Secondary | ICD-10-CM | POA: Diagnosis not present

## 2019-10-01 DIAGNOSIS — I10 Essential (primary) hypertension: Secondary | ICD-10-CM | POA: Diagnosis not present

## 2019-10-01 DIAGNOSIS — E7800 Pure hypercholesterolemia, unspecified: Secondary | ICD-10-CM | POA: Diagnosis not present

## 2019-12-27 DIAGNOSIS — G4733 Obstructive sleep apnea (adult) (pediatric): Secondary | ICD-10-CM | POA: Diagnosis not present

## 2020-02-25 DIAGNOSIS — G4733 Obstructive sleep apnea (adult) (pediatric): Secondary | ICD-10-CM | POA: Diagnosis not present

## 2020-03-25 DIAGNOSIS — U071 COVID-19: Secondary | ICD-10-CM

## 2020-03-25 HISTORY — DX: COVID-19: U07.1

## 2020-03-28 ENCOUNTER — Ambulatory Visit (HOSPITAL_COMMUNITY)
Admission: RE | Admit: 2020-03-28 | Discharge: 2020-03-28 | Disposition: A | Discharge status: Home / Self Care | Payer: BC Managed Care – PPO | Source: Ambulatory Visit | Attending: Pulmonary Disease | Admitting: Pulmonary Disease

## 2020-03-28 ENCOUNTER — Other Ambulatory Visit: Payer: Self-pay | Admitting: Nurse Practitioner

## 2020-03-28 DIAGNOSIS — U071 COVID-19: Secondary | ICD-10-CM | POA: Insufficient documentation

## 2020-03-28 MED ORDER — FAMOTIDINE IN NACL 20-0.9 MG/50ML-% IV SOLN: 20.0000 mg | Freq: Once | INTRAVENOUS | Status: DC | PRN

## 2020-03-28 MED ORDER — METHYLPREDNISOLONE SODIUM SUCC 125 MG IJ SOLR: 125.0000 mg | Freq: Once | INTRAMUSCULAR | Status: DC | PRN

## 2020-03-28 MED ORDER — ALBUTEROL SULFATE HFA 108 (90 BASE) MCG/ACT IN AERS: 2.0000 | INHALATION_SPRAY | Freq: Once | RESPIRATORY_TRACT | Status: DC | PRN

## 2020-03-28 MED ORDER — DIPHENHYDRAMINE HCL 50 MG/ML IJ SOLN: 50.0000 mg | Freq: Once | INTRAMUSCULAR | Status: DC | PRN

## 2020-03-28 MED ORDER — SODIUM CHLORIDE 0.9 % IV SOLN: INTRAVENOUS | Status: DC | PRN

## 2020-03-28 MED ORDER — SOTROVIMAB 500 MG/8ML IV SOLN
500.0000 mg | Freq: Once | INTRAVENOUS | Status: AC
  Administered 2020-03-28: 500 mg via INTRAVENOUS

## 2020-03-28 MED ORDER — EPINEPHRINE 0.3 MG/0.3ML IJ SOAJ: 0.3000 mg | Freq: Once | INTRAMUSCULAR | Status: DC | PRN

## 2020-03-28 NOTE — Progress Notes (Signed)
I connected by phone with Robyn Haber on 03/28/2020 at 2:08 PM to discuss the potential use of a treatment for mild to moderate COVID-19 viral infection in non-hospitalized patients.  This patient is a 59 y.o. male that meets the FDA criteria for Emergency Use Authorization of bamlanivimab/etesevimab, casirivimab\imdevimab, or sotrovimab  Has a (+) direct SARS-CoV-2 viral test result  Has mild or moderate COVID-19   Is ? 58 years of age and weighs ? 40 kg  Is NOT hospitalized due to COVID-19  Is NOT requiring oxygen therapy or requiring an increase in baseline oxygen flow rate due to COVID-19  Is within 10 days of symptom onset  Has at least one of the high risk factor(s) for progression to severe COVID-19 and/or hospitalization as defined in EUA.  Specific high risk criteria : BMI > 25 and Cardiovascular disease or hypertension   I have spoken and communicated the following to the patient or parent/caregiver:  1. FDA has authorized the emergency use of bamlanivimab/etesevimab, casirivimab\imdevimab, or sotrovimab for the treatment of mild to moderate COVID-19 in adults and pediatric patients with positive results of direct SARS-CoV-2 viral testing who are 74 years of age and older weighing at least 40 kg, and who are at high risk for progressing to severe COVID-19 and/or hospitalization.  2. The significant known and potential risks and benefits of bamlanivimab/etesevimab, casirivimab\imdevimab, or sotrovimab, and the extent to which such potential risks and benefits are unknown.  3. Information on available alternative treatments and the risks and benefits of those alternatives, including clinical trials.  4. Patients treated with bamlanivimab/etesevimab, casirivimab\imdevimab, or sotrovimab should continue to self-isolate and use infection control measures (e.g., wear mask, isolate, social distance, avoid sharing personal items, clean and disinfect "high touch" surfaces, and frequent  handwashing) according to CDC guidelines.   5. The patient or parent/caregiver has the option to accept or refuse bamlanivimab/etesevimab, casirivimab\imdevimab, or sotrovimab.  After reviewing this information with the patient, the patient has agreed to receive one of the available covid 19 monoclonal antibodies and will be provided an appropriate fact sheet prior to infusion.Beckey Rutter, [place], AGNP-C [phone] ([place])

## 2020-03-28 NOTE — Discharge Instructions (Signed)

## 2020-03-28 NOTE — Progress Notes (Signed)
  Diagnosis: COVID-19  Physician: Dr. Joya Gaskins  Procedure: Allergies reviewed.  Sotrovimab administered via IV infusion after med fact sheet provided to patient and all questions answered. Discharge instructions provided to patient; all questions answered.  Complications: No immediate complications noted.  Discharge: Discharged home   Monna Fam 03/28/2020

## 2020-04-28 DIAGNOSIS — G4733 Obstructive sleep apnea (adult) (pediatric): Secondary | ICD-10-CM | POA: Diagnosis not present

## 2020-06-16 DIAGNOSIS — Z86010 Personal history of colonic polyps: Secondary | ICD-10-CM | POA: Diagnosis not present

## 2020-06-16 DIAGNOSIS — E7800 Pure hypercholesterolemia, unspecified: Secondary | ICD-10-CM | POA: Diagnosis not present

## 2020-06-16 DIAGNOSIS — I10 Essential (primary) hypertension: Secondary | ICD-10-CM | POA: Diagnosis not present

## 2020-06-16 DIAGNOSIS — G4733 Obstructive sleep apnea (adult) (pediatric): Secondary | ICD-10-CM | POA: Diagnosis not present

## 2020-08-04 DIAGNOSIS — I10 Essential (primary) hypertension: Secondary | ICD-10-CM | POA: Diagnosis not present

## 2020-08-28 DIAGNOSIS — D3617 Benign neoplasm of peripheral nerves and autonomic nervous system of trunk, unspecified: Secondary | ICD-10-CM | POA: Diagnosis not present

## 2020-08-28 DIAGNOSIS — D485 Neoplasm of uncertain behavior of skin: Secondary | ICD-10-CM | POA: Diagnosis not present

## 2020-08-28 DIAGNOSIS — L814 Other melanin hyperpigmentation: Secondary | ICD-10-CM | POA: Diagnosis not present

## 2020-08-28 DIAGNOSIS — D2262 Melanocytic nevi of left upper limb, including shoulder: Secondary | ICD-10-CM | POA: Diagnosis not present

## 2020-08-28 DIAGNOSIS — D225 Melanocytic nevi of trunk: Secondary | ICD-10-CM | POA: Diagnosis not present

## 2020-08-28 DIAGNOSIS — D224 Melanocytic nevi of scalp and neck: Secondary | ICD-10-CM | POA: Diagnosis not present

## 2020-12-24 DIAGNOSIS — G4733 Obstructive sleep apnea (adult) (pediatric): Secondary | ICD-10-CM | POA: Diagnosis not present

## 2021-02-03 DIAGNOSIS — E7800 Pure hypercholesterolemia, unspecified: Secondary | ICD-10-CM | POA: Diagnosis not present

## 2021-02-09 DIAGNOSIS — Z8546 Personal history of malignant neoplasm of prostate: Secondary | ICD-10-CM | POA: Diagnosis not present

## 2021-02-09 DIAGNOSIS — E7800 Pure hypercholesterolemia, unspecified: Secondary | ICD-10-CM | POA: Diagnosis not present

## 2021-02-09 DIAGNOSIS — Z23 Encounter for immunization: Secondary | ICD-10-CM | POA: Diagnosis not present

## 2021-02-09 DIAGNOSIS — I10 Essential (primary) hypertension: Secondary | ICD-10-CM | POA: Diagnosis not present

## 2021-02-09 DIAGNOSIS — R7303 Prediabetes: Secondary | ICD-10-CM | POA: Diagnosis not present

## 2021-04-27 DIAGNOSIS — R059 Cough, unspecified: Secondary | ICD-10-CM | POA: Diagnosis not present

## 2021-05-13 DIAGNOSIS — G4733 Obstructive sleep apnea (adult) (pediatric): Secondary | ICD-10-CM | POA: Diagnosis not present

## 2021-05-19 DIAGNOSIS — M79604 Pain in right leg: Secondary | ICD-10-CM | POA: Diagnosis not present

## 2021-05-28 DIAGNOSIS — Z8546 Personal history of malignant neoplasm of prostate: Secondary | ICD-10-CM | POA: Diagnosis not present

## 2021-07-01 DIAGNOSIS — Z8546 Personal history of malignant neoplasm of prostate: Secondary | ICD-10-CM | POA: Diagnosis not present

## 2021-08-06 DIAGNOSIS — R7303 Prediabetes: Secondary | ICD-10-CM | POA: Diagnosis not present

## 2021-08-06 DIAGNOSIS — E7800 Pure hypercholesterolemia, unspecified: Secondary | ICD-10-CM | POA: Diagnosis not present

## 2021-08-11 DIAGNOSIS — Z23 Encounter for immunization: Secondary | ICD-10-CM | POA: Diagnosis not present

## 2021-08-11 DIAGNOSIS — I10 Essential (primary) hypertension: Secondary | ICD-10-CM | POA: Diagnosis not present

## 2021-08-11 DIAGNOSIS — E7800 Pure hypercholesterolemia, unspecified: Secondary | ICD-10-CM | POA: Diagnosis not present

## 2021-08-11 DIAGNOSIS — R7303 Prediabetes: Secondary | ICD-10-CM | POA: Diagnosis not present

## 2021-08-11 DIAGNOSIS — E669 Obesity, unspecified: Secondary | ICD-10-CM | POA: Diagnosis not present

## 2021-09-29 DIAGNOSIS — I10 Essential (primary) hypertension: Secondary | ICD-10-CM | POA: Diagnosis not present

## 2021-09-29 DIAGNOSIS — Z6834 Body mass index (BMI) 34.0-34.9, adult: Secondary | ICD-10-CM | POA: Diagnosis not present

## 2021-09-29 DIAGNOSIS — E669 Obesity, unspecified: Secondary | ICD-10-CM | POA: Diagnosis not present

## 2021-09-29 DIAGNOSIS — E7800 Pure hypercholesterolemia, unspecified: Secondary | ICD-10-CM | POA: Diagnosis not present

## 2021-11-02 DIAGNOSIS — E7800 Pure hypercholesterolemia, unspecified: Secondary | ICD-10-CM | POA: Diagnosis not present

## 2021-11-17 DIAGNOSIS — Z6833 Body mass index (BMI) 33.0-33.9, adult: Secondary | ICD-10-CM | POA: Diagnosis not present

## 2021-11-17 DIAGNOSIS — I10 Essential (primary) hypertension: Secondary | ICD-10-CM | POA: Diagnosis not present

## 2021-11-17 DIAGNOSIS — E669 Obesity, unspecified: Secondary | ICD-10-CM | POA: Diagnosis not present

## 2021-11-17 DIAGNOSIS — E7800 Pure hypercholesterolemia, unspecified: Secondary | ICD-10-CM | POA: Diagnosis not present

## 2022-01-03 DIAGNOSIS — L03312 Cellulitis of back [any part except buttock]: Secondary | ICD-10-CM | POA: Diagnosis not present

## 2022-01-12 DIAGNOSIS — L089 Local infection of the skin and subcutaneous tissue, unspecified: Secondary | ICD-10-CM | POA: Diagnosis not present

## 2022-01-12 DIAGNOSIS — Z6832 Body mass index (BMI) 32.0-32.9, adult: Secondary | ICD-10-CM | POA: Diagnosis not present

## 2022-01-12 DIAGNOSIS — L918 Other hypertrophic disorders of the skin: Secondary | ICD-10-CM | POA: Diagnosis not present

## 2022-01-12 DIAGNOSIS — L723 Sebaceous cyst: Secondary | ICD-10-CM | POA: Diagnosis not present

## 2022-02-02 DIAGNOSIS — I10 Essential (primary) hypertension: Secondary | ICD-10-CM | POA: Diagnosis not present

## 2022-02-02 DIAGNOSIS — R7303 Prediabetes: Secondary | ICD-10-CM | POA: Diagnosis not present

## 2022-02-10 ENCOUNTER — Ambulatory Visit: Payer: Self-pay | Admitting: Surgery

## 2022-02-10 DIAGNOSIS — L723 Sebaceous cyst: Secondary | ICD-10-CM | POA: Diagnosis not present

## 2022-02-10 NOTE — H&P (Signed)
Sean Doyle [id number]   Referring Provider:  Leitha Bleak, MD   Subjective   Chief Complaint: New Consultation (Sebaceous Cyst - Back Left side )     History of Present Illness:    61 year old male with history of hypertension, prostate cancer, obesity, obstructive sleep apnea, prediabetes referred for evaluation of sebaceous cyst on his back.  This has been infected recently and he had to go to an urgent care appointment, apparently it was not amenable to I&D and he was prescribed antibiotics.  This was about 1 month ago.  He follow-up with his primary care doctor a little over 3 weeks ago and still has some evidence of inflammation so his antibiotic course was extended.  It is looking much better now per his wife.  He has not had any pain but does note pressure sensation and being more aware of the lesion since it became infected.  He also notes a second lesion on his back.  He is unsure how long these have been there.    Review of Systems: A complete review of systems was obtained from the patient.  I have reviewed this information and discussed as appropriate with the patient.  See HPI as well for other ROS.   Medical History: Past Medical History:  Diagnosis Date   Arthritis    History of cancer    Hyperlipidemia    Hypertension    Seizures (CMS-HCC)    Sleep apnea     Patient Active Problem List  Diagnosis   Angioma   Essential hypertension   Lentigo   Malignant neoplasm of prostate (CMS-HCC)   Nevus of scalp   Vasovagal syncope    Past Surgical History:  Procedure Laterality Date   TONSILLECTOMY  1968   PROSTATE SURGERY  04/2017   Cancer     No Known Allergies  Current Outpatient Medications on File Prior to Visit  Medication Sig Dispense Refill   lisinopriL-hydroCHLOROthiazide (ZESTORETIC) 20-12.5 mg tablet      metoprolol succinate (TOPROL-XL) 50 MG XL tablet      rosuvastatin (CRESTOR) 10 MG tablet      No current facility-administered  medications on file prior to visit.    History reviewed. No pertinent family history.   Social History   Tobacco Use  Smoking Status Never  Smokeless Tobacco Never     Social History   Socioeconomic History   Marital status: Married  Tobacco Use   Smoking status: Never   Smokeless tobacco: Never  Substance and Sexual Activity   Alcohol use: Not Currently   Drug use: Never    Objective:    Vitals:   02/10/22 1531  BP: 120/72  Pulse: 56  Temp: 36.2 C (97.1 F)  SpO2: 99%  Weight: (!) 105.7 kg (233 lb)  Height: 180.3 cm ('5\' 11"'$ )    Body mass index is 32.5 kg/m.  Alert and well-appearing Unlabored respirations On the upper back just to the right of midline is a quiescent, 2 cm subcutaneous cyst consistent with a sebaceous cyst.  To the left of midline slightly inferior to that there is a soft mobile subcutaneous cyst with faint overlying erythema but no tenderness, induration or warmth, also about 2 to 3 cm in maximal diameter consistent with an additional subcutaneous cyst.  Assessment and Plan:  Diagnoses and all orders for this visit:  Sebaceous cyst Comments: With history of infection  He actually has 2 of these on his back, the one on the  left has recently been infected.  Given history infection I do recommend proceeding with excision.  We discussed the procedure including risks of bleeding, infection, pain, scarring, wound healing problems or cosmetic issues, recurrence of the lesions.  Questions welcomed and answered to his satisfaction.  He would like to proceed with having both of the cysts removed.  Aline Wesche Raquel James, MD

## 2022-02-11 DIAGNOSIS — Z23 Encounter for immunization: Secondary | ICD-10-CM | POA: Diagnosis not present

## 2022-02-11 DIAGNOSIS — Z0000 Encounter for general adult medical examination without abnormal findings: Secondary | ICD-10-CM | POA: Diagnosis not present

## 2022-02-11 DIAGNOSIS — I10 Essential (primary) hypertension: Secondary | ICD-10-CM | POA: Diagnosis not present

## 2022-02-11 DIAGNOSIS — Z86010 Personal history of colonic polyps: Secondary | ICD-10-CM | POA: Diagnosis not present

## 2022-02-11 DIAGNOSIS — E7800 Pure hypercholesterolemia, unspecified: Secondary | ICD-10-CM | POA: Diagnosis not present

## 2022-02-11 DIAGNOSIS — R7303 Prediabetes: Secondary | ICD-10-CM | POA: Diagnosis not present

## 2022-03-01 DIAGNOSIS — I10 Essential (primary) hypertension: Secondary | ICD-10-CM | POA: Diagnosis not present

## 2022-03-01 DIAGNOSIS — E669 Obesity, unspecified: Secondary | ICD-10-CM | POA: Diagnosis not present

## 2022-03-01 DIAGNOSIS — G4733 Obstructive sleep apnea (adult) (pediatric): Secondary | ICD-10-CM | POA: Diagnosis not present

## 2022-03-30 ENCOUNTER — Encounter (HOSPITAL_BASED_OUTPATIENT_CLINIC_OR_DEPARTMENT_OTHER): Payer: Self-pay | Admitting: Surgery

## 2022-03-30 ENCOUNTER — Other Ambulatory Visit: Payer: Self-pay

## 2022-03-30 DIAGNOSIS — L723 Sebaceous cyst: Secondary | ICD-10-CM

## 2022-03-30 HISTORY — DX: Sebaceous cyst: L72.3

## 2022-03-30 NOTE — Progress Notes (Signed)
Spoke w/ via phone for pre-op interview---Sean Doyle needs dos---- ISTAT, EKG              Doyle results------none COVID test -----patient states asymptomatic no test needed Arrive at -------0530 on Thursday, 04/01/2022 NPO after MN NO Solid Food.  Clear liquids from MN until---0430 Med rec completed Medications to take morning of surgery -----Metoprolol, Rosuvastatin Diabetic medication -----n/a Patient instructed no nail polish to be worn day of surgery Patient instructed to bring photo id and insurance card day of surgery Patient aware to have Driver (ride ) / caregiver    for 24 hours after surgery  Patient Special Instructions -----Bring cpap on day of surgery. Pre-Op special Istructions -----none Patient verbalized understanding of instructions that were given at this phone interview. Patient denies shortness of breath, chest pain, fever, cough at this phone interview.

## 2022-04-01 ENCOUNTER — Other Ambulatory Visit: Payer: Self-pay

## 2022-04-01 ENCOUNTER — Ambulatory Visit (HOSPITAL_BASED_OUTPATIENT_CLINIC_OR_DEPARTMENT_OTHER): Payer: BC Managed Care – PPO | Admitting: Certified Registered Nurse Anesthetist

## 2022-04-01 ENCOUNTER — Encounter (HOSPITAL_BASED_OUTPATIENT_CLINIC_OR_DEPARTMENT_OTHER): Payer: Self-pay | Admitting: Surgery

## 2022-04-01 ENCOUNTER — Ambulatory Visit (HOSPITAL_BASED_OUTPATIENT_CLINIC_OR_DEPARTMENT_OTHER)
Admission: RE | Admit: 2022-04-01 | Discharge: 2022-04-01 | Disposition: A | Discharge status: Home / Self Care | Payer: BC Managed Care – PPO | Attending: Surgery | Admitting: Surgery

## 2022-04-01 ENCOUNTER — Encounter (HOSPITAL_BASED_OUTPATIENT_CLINIC_OR_DEPARTMENT_OTHER)
Admission: RE | Disposition: A | Discharge status: Home / Self Care | Payer: Self-pay | Source: Home / Self Care | Attending: Surgery

## 2022-04-01 DIAGNOSIS — Z01818 Encounter for other preprocedural examination: Secondary | ICD-10-CM

## 2022-04-01 DIAGNOSIS — G4730 Sleep apnea, unspecified: Secondary | ICD-10-CM | POA: Insufficient documentation

## 2022-04-01 DIAGNOSIS — I10 Essential (primary) hypertension: Secondary | ICD-10-CM | POA: Diagnosis not present

## 2022-04-01 DIAGNOSIS — L723 Sebaceous cyst: Secondary | ICD-10-CM | POA: Diagnosis not present

## 2022-04-01 DIAGNOSIS — L720 Epidermal cyst: Secondary | ICD-10-CM | POA: Insufficient documentation

## 2022-04-01 HISTORY — DX: Prediabetes: R73.03

## 2022-04-01 HISTORY — PX: EXCISION OF KELOID: SHX6267

## 2022-04-01 LAB — POCT I-STAT, CHEM 8
BUN: 21 mg/dL — ABNORMAL HIGH (ref 6–20)
Calcium, Ion: 1.22 mmol/L (ref 1.15–1.40)
Chloride: 101 mmol/L (ref 98–111)
Creatinine, Ser: 0.9 mg/dL (ref 0.61–1.24)
Glucose, Bld: 112 mg/dL — ABNORMAL HIGH (ref 70–99)
HCT: 44 % (ref 39.0–52.0)
Hemoglobin: 15 g/dL (ref 13.0–17.0)
Potassium: 4 mmol/L (ref 3.5–5.1)
Sodium: 140 mmol/L (ref 135–145)
TCO2: 24 mmol/L (ref 22–32)

## 2022-04-01 SURGERY — EXCISION, KELOID
Anesthesia: General | Site: Back

## 2022-04-01 MED ORDER — FENTANYL CITRATE (PF) 100 MCG/2ML IJ SOLN: 25.0000 ug | INTRAMUSCULAR | Status: DC | PRN

## 2022-04-01 MED ORDER — TRAMADOL HCL 50 MG PO TABS: 50.0000 mg | ORAL_TABLET | Freq: Four times a day (QID) | ORAL | 0 refills | Status: AC | PRN

## 2022-04-01 MED ORDER — CHLORHEXIDINE GLUCONATE 4 % EX LIQD: 60.0000 mL | Freq: Once | CUTANEOUS | Status: DC

## 2022-04-01 MED ORDER — ONDANSETRON HCL 4 MG/2ML IJ SOLN
INTRAMUSCULAR | Status: DC | PRN
  Administered 2022-04-01: 4 mg via INTRAVENOUS

## 2022-04-01 MED ORDER — HYDROMORPHONE HCL 1 MG/ML IJ SOLN: 0.2500 mg | INTRAMUSCULAR | Status: DC | PRN

## 2022-04-01 MED ORDER — PROPOFOL 10 MG/ML IV BOLUS
INTRAVENOUS | Status: DC | PRN
  Administered 2022-04-01: 20 mg via INTRAVENOUS

## 2022-04-01 MED ORDER — FENTANYL CITRATE (PF) 250 MCG/5ML IJ SOLN
INTRAMUSCULAR | Status: DC | PRN
  Administered 2022-04-01: 25 ug via INTRAVENOUS

## 2022-04-01 MED ORDER — MIDAZOLAM HCL 2 MG/2ML IJ SOLN
INTRAMUSCULAR | Status: AC
  Filled 2022-04-01: qty 2

## 2022-04-01 MED ORDER — BUPIVACAINE LIPOSOME 1.3 % IJ SUSP: 20.0000 mL | Freq: Once | INTRAMUSCULAR | Status: DC

## 2022-04-01 MED ORDER — CEFAZOLIN SODIUM-DEXTROSE 2-4 GM/100ML-% IV SOLN
2.0000 g | INTRAVENOUS | Status: AC
  Administered 2022-04-01: 2 g via INTRAVENOUS

## 2022-04-01 MED ORDER — SODIUM CHLORIDE 0.9% FLUSH: 3.0000 mL | INTRAVENOUS | Status: DC | PRN

## 2022-04-01 MED ORDER — ACETAMINOPHEN 500 MG PO TABS
1000.0000 mg | ORAL_TABLET | ORAL | Status: AC
  Administered 2022-04-01: 1000 mg via ORAL

## 2022-04-01 MED ORDER — ACETAMINOPHEN 500 MG PO TABS
ORAL_TABLET | ORAL | Status: AC
  Filled 2022-04-01: qty 2

## 2022-04-01 MED ORDER — BUPIVACAINE-EPINEPHRINE 0.25% -1:200000 IJ SOLN
INTRAMUSCULAR | Status: DC | PRN
  Administered 2022-04-01: 2 mL

## 2022-04-01 MED ORDER — FENTANYL CITRATE (PF) 100 MCG/2ML IJ SOLN
INTRAMUSCULAR | Status: AC
  Filled 2022-04-01: qty 2

## 2022-04-01 MED ORDER — LACTATED RINGERS IV SOLN: INTRAVENOUS | Status: DC

## 2022-04-01 MED ORDER — SODIUM CHLORIDE 0.9% FLUSH: 3.0000 mL | Freq: Two times a day (BID) | INTRAVENOUS | Status: DC

## 2022-04-01 MED ORDER — OXYCODONE HCL 5 MG PO TABS: 5.0000 mg | ORAL_TABLET | ORAL | Status: DC | PRN

## 2022-04-01 MED ORDER — CEFAZOLIN SODIUM-DEXTROSE 2-4 GM/100ML-% IV SOLN
INTRAVENOUS | Status: AC
  Filled 2022-04-01: qty 100

## 2022-04-01 MED ORDER — ACETAMINOPHEN 325 MG RE SUPP: 650.0000 mg | RECTAL | Status: DC | PRN

## 2022-04-01 MED ORDER — GABAPENTIN 300 MG PO CAPS
300.0000 mg | ORAL_CAPSULE | ORAL | Status: AC
  Administered 2022-04-01: 300 mg via ORAL

## 2022-04-01 MED ORDER — LIDOCAINE 2% (20 MG/ML) 5 ML SYRINGE
INTRAMUSCULAR | Status: DC | PRN
  Administered 2022-04-01: 50 mg via INTRAVENOUS

## 2022-04-01 MED ORDER — GABAPENTIN 300 MG PO CAPS
ORAL_CAPSULE | ORAL | Status: AC
  Filled 2022-04-01: qty 1

## 2022-04-01 MED ORDER — PROPOFOL 500 MG/50ML IV EMUL
INTRAVENOUS | Status: DC | PRN
  Administered 2022-04-01: 150 ug/kg/min via INTRAVENOUS

## 2022-04-01 MED ORDER — PROPOFOL 10 MG/ML IV BOLUS
INTRAVENOUS | Status: AC
  Filled 2022-04-01: qty 20

## 2022-04-01 MED ORDER — 0.9 % SODIUM CHLORIDE (POUR BTL) OPTIME
TOPICAL | Status: DC | PRN
  Administered 2022-04-01: 500 mL

## 2022-04-01 MED ORDER — MIDAZOLAM HCL 2 MG/2ML IJ SOLN
INTRAMUSCULAR | Status: DC | PRN
  Administered 2022-04-01: 1 mg via INTRAVENOUS

## 2022-04-01 MED ORDER — ACETAMINOPHEN 325 MG PO TABS: 650.0000 mg | ORAL_TABLET | ORAL | Status: DC | PRN

## 2022-04-01 MED ORDER — SODIUM CHLORIDE 0.9 % IV SOLN: 250.0000 mL | INTRAVENOUS | Status: DC | PRN

## 2022-04-01 SURGICAL SUPPLY — 33 items
ADH SKN CLS APL DERMABOND .7 (GAUZE/BANDAGES/DRESSINGS) ×1
APL PRP STRL LF DISP 70% ISPRP (MISCELLANEOUS) ×1
BLADE SURG 15 STRL LF DISP TIS (BLADE) ×1 IMPLANT
BLADE SURG 15 STRL SS (BLADE) ×1
CHLORAPREP W/TINT 26 (MISCELLANEOUS) IMPLANT
COVER BACK TABLE 60X90IN (DRAPES) ×1 IMPLANT
COVER MAYO STAND STRL (DRAPES) ×1 IMPLANT
DERMABOND ADVANCED .7 DNX12 (GAUZE/BANDAGES/DRESSINGS) IMPLANT
DRAPE LAPAROTOMY 100X72 PEDS (DRAPES) IMPLANT
DRAPE UTILITY XL STRL (DRAPES) ×1 IMPLANT
ELECT REM PT RETURN 9FT ADLT (ELECTROSURGICAL) ×1
ELECTRODE REM PT RTRN 9FT ADLT (ELECTROSURGICAL) ×1 IMPLANT
GAUZE 4X4 16PLY ~~LOC~~+RFID DBL (SPONGE) ×1 IMPLANT
GAUZE SPONGE 4X4 12PLY STRL (GAUZE/BANDAGES/DRESSINGS) ×1 IMPLANT
GLOVE BIO SURGEON STRL SZ 6 (GLOVE) ×1 IMPLANT
GLOVE INDICATOR 6.5 STRL GRN (GLOVE) ×1 IMPLANT
GLOVE SURG SS PI 7.5 STRL IVOR (GLOVE) IMPLANT
GOWN STRL REUS W/TWL LRG LVL3 (GOWN DISPOSABLE) ×1 IMPLANT
KIT TURNOVER CYSTO (KITS) ×1 IMPLANT
MANIFOLD NEPTUNE II (INSTRUMENTS) IMPLANT
NDL HYPO 25X1 1.5 SAFETY (NEEDLE) IMPLANT
NEEDLE HYPO 25X1 1.5 SAFETY (NEEDLE) ×1 IMPLANT
PACK BASIN DAY SURGERY FS (CUSTOM PROCEDURE TRAY) ×1 IMPLANT
PENCIL SMOKE EVACUATOR (MISCELLANEOUS) ×1 IMPLANT
PUNCH BIOPSY DERMAL 4MM (INSTRUMENTS) IMPLANT
SUT MNCRL AB 4-0 PS2 18 (SUTURE) ×1 IMPLANT
SUT VIC AB 3-0 SH 27 (SUTURE) ×1
SUT VIC AB 3-0 SH 27X BRD (SUTURE) ×1 IMPLANT
SYR BULB IRRIG 60ML STRL (SYRINGE) ×1 IMPLANT
SYR CONTROL 10ML LL (SYRINGE) ×1 IMPLANT
TOWEL OR 17X26 10 PK STRL BLUE (TOWEL DISPOSABLE) ×1 IMPLANT
TUBE CONNECTING 12X1/4 (SUCTIONS) ×1 IMPLANT
YANKAUER SUCT BULB TIP NO VENT (SUCTIONS) ×1 IMPLANT

## 2022-04-01 NOTE — H&P (Signed)
Sean Doyle [id number]    Referring Provider:  Leitha Bleak, MD     Subjective    Chief Complaint: New Consultation (Sebaceous Cyst - Back Left side )       History of Present Illness:    61 year old male with history of hypertension, prostate cancer, obesity, obstructive sleep apnea, prediabetes referred for evaluation of sebaceous cyst on his back.  This has been infected recently and he had to go to an urgent care appointment, apparently it was not amenable to I&D and he was prescribed antibiotics.  This was about 1 month ago.  He follow-up with his primary care doctor a little over 3 weeks ago and still has some evidence of inflammation so his antibiotic course was extended.  It is looking much better now per his wife.  He has not had any pain but does note pressure sensation and being more aware of the lesion since it became infected.  He also notes a second lesion on his back.  He is unsure how long these have been there.       Review of Systems: A complete review of systems was obtained from the patient.  I have reviewed this information and discussed as appropriate with the patient.  See HPI as well for other ROS.     Medical History:     Past Medical History:  Diagnosis Date   Arthritis     History of cancer     Hyperlipidemia     Hypertension     Seizures (CMS-HCC)     Sleep apnea           Patient Active Problem List  Diagnosis   Angioma   Essential hypertension   Lentigo   Malignant neoplasm of prostate (CMS-HCC)   Nevus of scalp   Vasovagal syncope           Past Surgical History:  Procedure Laterality Date   TONSILLECTOMY   1968   PROSTATE SURGERY   04/2017    Cancer      No Known Allergies         Current Outpatient Medications on File Prior to Visit  Medication Sig Dispense Refill   lisinopriL-hydroCHLOROthiazide (ZESTORETIC) 20-12.5 mg tablet         metoprolol succinate (TOPROL-XL) 50 MG XL tablet         rosuvastatin (CRESTOR) 10 MG  tablet          No current facility-administered medications on file prior to visit.      History reviewed. No pertinent family history.    Social History       Tobacco Use  Smoking Status Never  Smokeless Tobacco Never      Social History        Socioeconomic History   Marital status: Married  Tobacco Use   Smoking status: Never   Smokeless tobacco: Never  Substance and Sexual Activity   Alcohol use: Not Currently   Drug use: Never      Objective:         Vitals:    02/10/22 1531  BP: 120/72  Pulse: 56  Temp: 36.2 C (97.1 F)  SpO2: 99%  Weight: (!) 105.7 kg (233 lb)  Height: 180.3 cm ('5\' 11"'$ )    Body mass index is 32.5 kg/m.   Alert and well-appearing Unlabored respirations On the upper back just to the right of midline is a quiescent, 2 cm subcutaneous cyst consistent with a sebaceous cyst.  To the  left of midline slightly inferior to that there is a soft mobile subcutaneous cyst with faint overlying erythema but no tenderness, induration or warmth, also about 2 to 3 cm in maximal diameter consistent with an additional subcutaneous cyst.   Assessment and Plan:  Diagnoses and all orders for this visit:   Sebaceous cyst Comments: With history of infection   He actually has 2 of these on his back, the one on the left has recently been infected.  Given history infection I do recommend proceeding with excision.  We discussed the procedure including risks of bleeding, infection, pain, scarring, wound healing problems or cosmetic issues, recurrence of the lesions.  Questions welcomed and answered to his satisfaction.  He would like to proceed with having both of the cysts removed.   Severa Jeremiah Raquel James, MD    On the day of surgery, the patient notes that the region that had been infected is completely resolved.  On exam there is a small pore year up, but really no appreciable palpable remaining cyst.  The question cyst in the right upper back is still  present.  We will plan to proceed with excision of the obvious cyst and if residual cyst is more appreciable when the patient is prone in the OR on the left side we will go ahead and remove that as well, but if there is no palpable abnormality we will plan to leave that alone.

## 2022-04-01 NOTE — Op Note (Signed)
Operative Note  Sean Doyle  [id number]  [id number]  04/01/2022   Surgeon: Romana Juniper MD FACS   Procedure performed: Excision of sebaceous cyst, 2 cm x 2 cm, right upper back; excision of 5 mm residual sebaceous cyst left upper back   Preop diagnosis: Sebaceous cyst with history of infection Post-op diagnosis/intraop findings: Same   Specimens: Right upper back sebaceous cyst Retained items: no  EBL: Minimal cc Complications: none   Description of procedure: After obtaining informed consent the patient was taken to the operating room and placed prone on operating room table where MAC was initiated, preoperative antibiotics were administered, SCDs applied, and a formal timeout was performed.  The upper back was prepped and draped in usual sterile fashion.  After infiltration with 1% Marcaine with epinephrine, an elliptical transverse incision was made over the palpable cyst on the right upper back.  The soft tissues were dissected with cautery until the cyst wall was encountered.  The cyst wall was then followed and circumferentially dissected from the surrounding soft tissues, excising the cyst and contents intact.  This was handed off for pathology.  Hemostasis was ensured within the wound.  This was closed with interrupted deep dermal 3-0 Vicryl followed by running subcuticular 4-0 Monocryl and Dermabond.  Then turned to the evaluation of the left lower back where the patient had previously had an infected sebaceous cyst.  There was a residual pore but no overtly palpable cyst.  With compression of this area, we did extract sebaceous material from this port.  A 4 mm punch was used to core out the pore and associated residual cyst wall.  There was a small amount of cyst wall tracking inferior laterally which was excised with Metzenbaums.  Hemostasis was ensured within the wound.  This was closed with subcuticular 4 Monocryl and Dermabond.  The patient was then awakened and taken to PACU in  stable condition.    All counts were correct at the completion of the case.

## 2022-04-01 NOTE — Anesthesia Preprocedure Evaluation (Addendum)
Anesthesia Evaluation  Patient identified by MRN, date of birth, ID band Patient awake  General Assessment Comment:History noted Dr. Nyoka Cowden  Reviewed: Allergy & Precautions, NPO status , Patient's Chart, lab work & pertinent test results  Airway Mallampati: II       Dental   Pulmonary sleep apnea    breath sounds clear to auscultation       Cardiovascular hypertension,  Rhythm:Regular Rate:Normal     Neuro/Psych negative neurological ROS     GI/Hepatic negative GI ROS, Neg liver ROS,,,  Endo/Other  History noted Dr. Nyoka Cowden  Renal/GU negative Renal ROS     Musculoskeletal   Abdominal   Peds  Hematology   Anesthesia Other Findings   Reproductive/Obstetrics                             Anesthesia Physical Anesthesia Plan  ASA: 2  Anesthesia Plan: General   Post-op Pain Management: Tylenol PO (pre-op)*   Induction: Intravenous  PONV Risk Score and Plan: 3 and Ondansetron, Dexamethasone and Midazolam  Airway Management Planned: Simple Face Mask and Nasal Cannula  Additional Equipment:   Intra-op Plan:   Post-operative Plan:   Informed Consent: I have reviewed the patients History and Physical, chart, labs and discussed the procedure including the risks, benefits and alternatives for the proposed anesthesia with the patient or authorized representative who has indicated his/her understanding and acceptance.     Dental advisory given  Plan Discussed with: CRNA, Anesthesiologist and Surgeon  Anesthesia Plan Comments:        Anesthesia Quick Evaluation

## 2022-04-01 NOTE — Anesthesia Postprocedure Evaluation (Signed)
Anesthesia Post Note  Patient: Sean Doyle  Procedure(s) Performed: EXCISION OF SUBCUTANEOUS CYST x2 UPPER BACK (Back)     Anesthesia Type: General Anesthetic complications: no   No notable events documented.  Last Vitals:  Vitals:   04/01/22 0900 04/01/22 0945  BP: (!) 112/57 113/68  Pulse: (!) 45 83  Resp: 15 16  Temp: (!) 36.4 C (!) 36.4 C  SpO2: 100% 100%    Last Pain:  Vitals:   04/01/22 0945  TempSrc:   PainSc: 0-No pain                 Sonu Kruckenberg

## 2022-04-01 NOTE — Anesthesia Postprocedure Evaluation (Signed)
Anesthesia Post Note  Patient: Sean Doyle  Procedure(s) Performed: EXCISION OF SUBCUTANEOUS CYST x2 UPPER BACK (Back)     Patient location during evaluation: PACU Anesthesia Type: General Level of consciousness: awake Pain management: pain level controlled Vital Signs Assessment: post-procedure vital signs reviewed and stable Respiratory status: spontaneous breathing Cardiovascular status: stable Postop Assessment: no apparent nausea or vomiting Anesthetic complications: no   No notable events documented.  Last Vitals:  Vitals:   04/01/22 0900 04/01/22 0945  BP: (!) 112/57 113/68  Pulse: (!) 45 83  Resp: 15 16  Temp: (!) 36.4 C (!) 36.4 C  SpO2: 100% 100%    Last Pain:  Vitals:   04/01/22 0945  TempSrc:   PainSc: 0-No pain                 Sura Canul

## 2022-04-01 NOTE — Discharge Instructions (Addendum)
GENERAL SURGERY: POST OP INSTRUCTIONS  EAT Gradually transition to a high fiber diet with a fiber supplement over the next few weeks after discharge.  Start with a pureed / full liquid diet (see below)  WALK Walk an hour a day (cumulative, not all at once).  Control your pain to do that.    CONTROL PAIN Control pain so that you can walk, sleep, tolerate sneezing/coughing, go up/down stairs.  HAVE A BOWEL MOVEMENT DAILY Keep your bowels regular to avoid problems.  OK to try a laxative to override constipation.  OK to use an antidiarrheal to slow down diarrhea.  Call if not better after 2 tries  CALL IF YOU HAVE PROBLEMS/CONCERNS Call if you are still struggling despite following these instructions. Call if you have concerns not answered by these instructions    DIET: Follow a light bland diet & liquids the first 24 hours after arrival home, such as soup, liquids, starches, etc.  Be sure to drink plenty of fluids.  Quickly advance to a usual solid diet within a few days.  Avoid fast food or heavy meals as your are more likely to get nauseated or have irregular bowels.  A low-sugar, high-fiber diet for the rest of your life is ideal.   Take your usually prescribed home medications unless otherwise directed. PAIN CONTROL: Pain is best controlled by a usual combination of three different methods TOGETHER: Ice/Heat Over the counter pain medication Prescription pain medication Most patients will experience some swelling and bruising around the incisions.  Ice packs or heating pads (30-60 minutes up to 6 times a day) will help. Use ice for the first few days to help decrease swelling and bruising, then switch to heat to help relax tight/sore spots and speed recovery.  Some people prefer to use ice alone, heat alone, alternating between ice & heat.  Experiment to what works for you.  Swelling and bruising can take several weeks to resolve.   It is helpful to take an over-the-counter pain  medication regularly for the first few weeks.  Choose one of the following that works best for you: Naproxen (Aleve, etc)  Two '220mg'$  tabs twice a day OR Ibuprofen (Advil, etc) Three '200mg'$  tabs four times a day (every meal & bedtime) AND Acetaminophen (Tylenol, etc) 500-'650mg'$  four times a day (every meal & bedtime) A  prescription for pain medication (such as oxycodone, hydrocodone, etc) should be given to you upon discharge.  Take your pain medication as prescribed.  If you are having problems/concerns with the prescription medicine (does not control pain, nausea, vomiting, rash, itching, etc), please call us [phone] to see if we need to switch you to a different pain medicine that will work better for you and/or control your side effect better. If you need a refill on your pain medication, please contact your pharmacy.  They will contact our office to request authorization. Prescriptions will not be filled after 5 pm or on week-ends. Avoid getting constipated.  Between the surgery and the pain medications, it is common to experience some constipation.  Increasing fluid intake and taking a fiber supplement (such as Metamucil, Citrucel, FiberCon, MiraLax, etc) 1-2 times a day regularly will usually help prevent this problem from occurring.  A mild laxative (prune juice, Milk of Magnesia, MiraLax, etc) should be taken according to package directions if there are no bowel movements after 48 hours.   Wash / shower every day, starting 2 days after surgery.  You may shower over the  skin glue which is waterproof.  Continue to shower over incision(s) after the dressing is off. Glue will flake off after about 2 weeks..  You may leave the incision open to air.  You may replace a dressing/Band-Aid to cover the incision for comfort if you wish.      ACTIVITIES as tolerated:   You may resume regular (light) daily activities beginning the next day--such as daily self-care, walking, climbing stairs--gradually  increasing activities as tolerated.  If you can walk 30 minutes without difficulty, it is safe to try more intense activity such as jogging, treadmill, bicycling, low-impact aerobics, swimming, etc. Save the most intensive and strenuous activity for last such as sit-ups, heavy lifting, contact sports, etc  Refrain from any heavy lifting or straining until you are off narcotics for pain control.   DO NOT PUSH THROUGH PAIN.  Let pain be your guide: If it hurts to do something, don't do it.  Pain is your body warning you to avoid that activity for another week until the pain goes down. You may drive when you are no longer taking prescription pain medication, you can comfortably wear a seatbelt, and you can safely maneuver your car and apply brakes. You may have sexual intercourse when it is comfortable.  FOLLOW UP in our office Please call [facility] at [phone] to set up an appointment to see your surgeon in the office for a follow-up appointment approximately 2-3 weeks after your surgery. Make sure that you call for this appointment the day you arrive home to insure a convenient appointment time. 9. IF YOU HAVE DISABILITY OR FAMILY LEAVE FORMS, BRING THEM TO THE OFFICE FOR PROCESSING.  DO NOT GIVE THEM TO YOUR DOCTOR.   WHEN TO CALL us [phone]: Poor pain control Reactions / problems with new medications (rash/itching, nausea, etc)  Fever over 101.5 F (38.5 C) Worsening swelling or bruising Continued bleeding from incision. Increased pain, redness, or drainage from the incision Difficulty breathing / swallowing   The clinic staff is available to answer your questions during regular business hours (8:30am-5pm).  Please don't hesitate to call and ask to speak to one of our nurses for clinical concerns.   If you have a medical emergency, go to the nearest emergency room or call 911.  A surgeon from [facility] is always on call at the [facility],  [place], [place], [place], [place]  [id number] ? MAIN: [phone] ? TOLL FREE: [phone] ?  FAX [phone] [email or web address]     No acetaminophen/Tylenol until after 12:00pm today if needed for pain.      Post Anesthesia Home Care Instructions  Activity: Get plenty of rest for the remainder of the day. A responsible individual must stay with you for 24 hours following the procedure.  For the next 24 hours, DO NOT: -Drive a car -Paediatric nurse -Drink alcoholic beverages -Take any medication unless instructed by your physician -Make any legal decisions or sign important papers.  Meals: Start with liquid foods such as gelatin or soup. Progress to regular foods as tolerated. Avoid greasy, spicy, heavy foods. If nausea and/or vomiting occur, drink only clear liquids until the nausea and/or vomiting subsides. Call your physician if vomiting continues.  Special Instructions/Symptoms: Your throat may feel dry or sore from the anesthesia or the breathing tube placed in your throat during surgery. If this causes discomfort, gargle with warm salt water. The discomfort should disappear within 24 hours.

## 2022-04-01 NOTE — Transfer of Care (Signed)
Immediate Anesthesia Transfer of Care Note  Patient: Briar Witherspoon  Procedure(s) Performed: EXCISION OF SUBCUTANEOUS CYST x2 UPPER BACK (Back)  Patient Location: PACU  Anesthesia Type:MAC  Level of Consciousness: awake, alert , and oriented  Airway & Oxygen Therapy: Patient Spontanous Breathing  Post-op Assessment: Report given to RN and Post -op Vital signs reviewed and stable  Post vital signs: Reviewed and stable  Last Vitals:  Vitals Value Taken Time  BP    Temp    Pulse 56 04/01/22 0827  Resp 20 04/01/22 0827  SpO2 96 % 04/01/22 0827  Vitals shown include unvalidated device data.  Last Pain:  Vitals:   04/01/22 0554  TempSrc: Oral  PainSc: 0-No pain      Patients Stated Pain Goal: 6 (33/00/76 2263)  Complications: No notable events documented.

## 2022-04-02 ENCOUNTER — Encounter (HOSPITAL_BASED_OUTPATIENT_CLINIC_OR_DEPARTMENT_OTHER): Payer: Self-pay | Admitting: Surgery

## 2022-04-02 LAB — SURGICAL PATHOLOGY

## 2022-05-10 DIAGNOSIS — K5730 Diverticulosis of large intestine without perforation or abscess without bleeding: Secondary | ICD-10-CM | POA: Diagnosis not present

## 2022-05-10 DIAGNOSIS — Z09 Encounter for follow-up examination after completed treatment for conditions other than malignant neoplasm: Secondary | ICD-10-CM | POA: Diagnosis not present

## 2022-05-10 DIAGNOSIS — Z86010 Personal history of colonic polyps: Secondary | ICD-10-CM | POA: Diagnosis not present

## 2022-05-10 DIAGNOSIS — K5520 Angiodysplasia of colon without hemorrhage: Secondary | ICD-10-CM | POA: Diagnosis not present

## 2022-05-10 DIAGNOSIS — K648 Other hemorrhoids: Secondary | ICD-10-CM | POA: Diagnosis not present

## 2022-07-07 DIAGNOSIS — Z8546 Personal history of malignant neoplasm of prostate: Secondary | ICD-10-CM | POA: Diagnosis not present

## 2022-07-14 DIAGNOSIS — Z8546 Personal history of malignant neoplasm of prostate: Secondary | ICD-10-CM | POA: Diagnosis not present

## 2022-10-13 DIAGNOSIS — Z8546 Personal history of malignant neoplasm of prostate: Secondary | ICD-10-CM | POA: Diagnosis not present

## 2023-01-17 DIAGNOSIS — Z8546 Personal history of malignant neoplasm of prostate: Secondary | ICD-10-CM | POA: Diagnosis not present

## 2023-04-07 DIAGNOSIS — G4733 Obstructive sleep apnea (adult) (pediatric): Secondary | ICD-10-CM | POA: Diagnosis not present

## 2023-04-11 DIAGNOSIS — Z8546 Personal history of malignant neoplasm of prostate: Secondary | ICD-10-CM | POA: Diagnosis not present

## 2023-04-18 DIAGNOSIS — Z8546 Personal history of malignant neoplasm of prostate: Secondary | ICD-10-CM | POA: Diagnosis not present

## 2023-05-02 DIAGNOSIS — Z23 Encounter for immunization: Secondary | ICD-10-CM | POA: Diagnosis not present

## 2023-05-02 DIAGNOSIS — R7303 Prediabetes: Secondary | ICD-10-CM | POA: Diagnosis not present

## 2023-05-02 DIAGNOSIS — G4733 Obstructive sleep apnea (adult) (pediatric): Secondary | ICD-10-CM | POA: Diagnosis not present

## 2023-05-02 DIAGNOSIS — I10 Essential (primary) hypertension: Secondary | ICD-10-CM | POA: Diagnosis not present

## 2023-05-02 DIAGNOSIS — E7800 Pure hypercholesterolemia, unspecified: Secondary | ICD-10-CM | POA: Diagnosis not present

## 2023-06-02 ENCOUNTER — Other Ambulatory Visit: Payer: Self-pay

## 2023-06-02 ENCOUNTER — Encounter (HOSPITAL_BASED_OUTPATIENT_CLINIC_OR_DEPARTMENT_OTHER): Payer: Self-pay

## 2023-06-02 ENCOUNTER — Emergency Department (HOSPITAL_BASED_OUTPATIENT_CLINIC_OR_DEPARTMENT_OTHER): Payer: BC Managed Care – PPO

## 2023-06-02 ENCOUNTER — Emergency Department (HOSPITAL_BASED_OUTPATIENT_CLINIC_OR_DEPARTMENT_OTHER)
Admission: EM | Admit: 2023-06-02 | Discharge: 2023-06-02 | Disposition: A | Discharge status: Home / Self Care | Payer: BC Managed Care – PPO | Attending: Emergency Medicine | Admitting: Emergency Medicine

## 2023-06-02 DIAGNOSIS — Z8546 Personal history of malignant neoplasm of prostate: Secondary | ICD-10-CM | POA: Insufficient documentation

## 2023-06-02 DIAGNOSIS — Z79899 Other long term (current) drug therapy: Secondary | ICD-10-CM | POA: Insufficient documentation

## 2023-06-02 DIAGNOSIS — R569 Unspecified convulsions: Secondary | ICD-10-CM | POA: Diagnosis not present

## 2023-06-02 DIAGNOSIS — R55 Syncope and collapse: Secondary | ICD-10-CM | POA: Insufficient documentation

## 2023-06-02 DIAGNOSIS — I10 Essential (primary) hypertension: Secondary | ICD-10-CM | POA: Diagnosis not present

## 2023-06-02 DIAGNOSIS — R42 Dizziness and giddiness: Secondary | ICD-10-CM | POA: Diagnosis not present

## 2023-06-02 DIAGNOSIS — R7303 Prediabetes: Secondary | ICD-10-CM | POA: Diagnosis not present

## 2023-06-02 DIAGNOSIS — G4733 Obstructive sleep apnea (adult) (pediatric): Secondary | ICD-10-CM | POA: Diagnosis not present

## 2023-06-02 DIAGNOSIS — Z1331 Encounter for screening for depression: Secondary | ICD-10-CM | POA: Diagnosis not present

## 2023-06-02 LAB — BASIC METABOLIC PANEL
Anion gap: 12 (ref 5–15)
BUN: 18 mg/dL (ref 8–23)
CO2: 24 mmol/L (ref 22–32)
Calcium: 9.2 mg/dL (ref 8.9–10.3)
Chloride: 97 mmol/L — ABNORMAL LOW (ref 98–111)
Creatinine, Ser: 1 mg/dL (ref 0.61–1.24)
GFR, Estimated: >60 mL/min (ref >60)
Glucose, Bld: 155 mg/dL — ABNORMAL HIGH (ref 70–99)
Potassium: 3.9 mmol/L (ref 3.5–5.1)
Sodium: 133 mmol/L — ABNORMAL LOW (ref 135–145)

## 2023-06-02 LAB — CBC
HCT: 42.3 % (ref 39.0–52.0)
Hemoglobin: 15 g/dL (ref 13.0–17.0)
MCH: 30.9 pg (ref 26.0–34.0)
MCHC: 35.5 g/dL (ref 30.0–36.0)
MCV: 87.2 fL (ref 80.0–100.0)
Platelets: 209 10*3/uL (ref 150–400)
RBC: 4.85 MIL/uL (ref 4.22–5.81)
RDW: 12.7 % (ref 11.5–15.5)
WBC: 9.8 10*3/uL (ref 4.0–10.5)
nRBC: 0 % (ref 0.0–0.2)

## 2023-06-02 LAB — TROPONIN I (HIGH SENSITIVITY): Troponin I (High Sensitivity): 2 ng/L (ref <18)

## 2023-06-02 MED ORDER — SODIUM CHLORIDE 0.9 % IV BOLUS
1000.0000 mL | Freq: Once | INTRAVENOUS | Status: AC
  Administered 2023-06-02: 1000 mL via INTRAVENOUS

## 2023-06-02 NOTE — Discharge Instructions (Addendum)
 You have been seen today for your complaint of syncope. Your lab work was reassuring. Your imaging was reassuring. Home care instructions are as follows:  Eat a normal diet.  Drink plenty of water. Follow up with: Your primary care provider Please seek immediate medical care if you develop any of the following symptoms: You faint. You hit your head or are injured after fainting. You have any of these symptoms that may indicate trouble with your heart: Fast or irregular heartbeats (palpitations). Unusual pain in your chest, abdomen, or back. Shortness of breath. You have a seizure. You have a severe headache. You are confused. You have vision problems. You have severe weakness or trouble walking. You are bleeding from your mouth or rectum, or you have black or tarry stool. At this time there does not appear to be the presence of an emergent medical condition, however there is always the potential for conditions to change. Please read and follow the below instructions.  Do not take your medicine if  develop an itchy rash, swelling in your mouth or lips, or difficulty breathing; call 911 and seek immediate emergency medical attention if this occurs.  You may review your lab tests and imaging results in their entirety on your MyChart account.  Please discuss all results of fully with your primary care provider and other specialist at your follow-up visit.  Note: Portions of this text may have been transcribed using voice recognition software. Every effort was made to ensure accuracy; however, inadvertent computerized transcription errors may still be present.

## 2023-06-02 NOTE — ED Triage Notes (Signed)
 Pt at doctors office and had a syncopal episode after having his blood drawn. Nurse states pt was shaking and flinging his arms and had incontinence, upon waking up pt was not postictal he knew where he was and who he was. Pt now c/o dizziness and actively vomiting in triage.

## 2023-06-02 NOTE — ED Provider Notes (Signed)
 [place] [facility] Provider Note   CSN: [id number] Arrival date & time: 06/02/23  1135     History  Chief Complaint  Patient presents with   Loss of Consciousness    Sean Doyle is a 63 y.o. male.  With history of hypertension, prostate cancer, vasovagal syncope in the past presenting to the ED for evaluation of a syncopal episode.  He states he was at his primary care provider's office getting his blood drawn for fasting labs when he began to feel lightheaded and dizzy like he was going to faint.  He was in the chair at the time.  He was observed losing consciousness by the nursing aide.  While he was unconscious he reports being told that his body stiffened up and his face turned red.  He states that the nursing aide estimated 5 minutes of time unconscious.  When he regained consciousness he was moving his arms to push away the nursing aide.  He had an episode of urinary incontinence as well.  No specific seizure-like activity described.  No oral trauma.  He did not fall.  He is not anticoagulated.  Shortly afterwards he had an episode of vomiting.  And route to the emergency department he had a second episode of vomiting.  He denies any headache or vision changes.  No fevers or chills.  No neck stiffness.  No chest pain or shortness of breath.  No abdominal pain.  He was started on metformin and took his first dose yesterday evening.   Loss of Consciousness      Home Medications Prior to Admission medications   Medication Sig Start Date End Date Taking? Authorizing Provider  metFORMIN (GLUCOPHAGE) 500 MG tablet Take 500 mg by mouth 2 (two) times daily with a meal.   Yes [provider]  lisinopril-hydrochlorothiazide (ZESTORETIC) 20-25 MG tablet Take 1 tablet by mouth daily.    [provider]  metoprolol tartrate (LOPRESSOR) 50 MG tablet Take 50 mg by mouth 2 (two) times daily.    [provider]  rosuvastatin  (CRESTOR) 10 MG tablet Take 10 mg by mouth daily.    [provider]      Allergies    Patient has no known allergies.    Review of Systems   Review of Systems  Cardiovascular:  Positive for syncope.  Neurological:  Positive for syncope.  All other systems reviewed and are negative.   Physical Exam Updated Vital Signs BP 106/71   Pulse 64   Temp 97.6 F (36.4 C) (Oral)   Resp 18   Ht 5' 10 (1.778 m)   Wt 108.9 kg   SpO2 97%   BMI 34.44 kg/m  Physical Exam Vitals and nursing note reviewed.  Constitutional:      General: He is not in acute distress.    Appearance: He is well-developed.     Comments: Resting comfortably in bed  HENT:     Head: Normocephalic and atraumatic.  Eyes:     Conjunctiva/sclera: Conjunctivae normal.  Cardiovascular:     Rate and Rhythm: Normal rate and regular rhythm.     Heart sounds: No murmur heard. Pulmonary:     Effort: Pulmonary effort is normal. No respiratory distress.     Breath sounds: Normal breath sounds. No wheezing, rhonchi or rales.  Abdominal:     Palpations: Abdomen is soft.     Tenderness: There is no abdominal tenderness. There is no guarding.  Musculoskeletal:  General: No swelling.     Cervical back: Neck supple.  Skin:    General: Skin is warm and dry.     Capillary Refill: Capillary refill takes less than 2 seconds.  Neurological:     General: No focal deficit present.     Mental Status: He is alert and oriented to person, place, and time.     Comments:   MENTAL STATUS: AAOx3   LANG/SPEECH: Fluent, intact naming, repetition & comprehension   CRANIAL NERVES:   II: Pupils equal and reactive   III, IV, VI: EOM intact, no gaze preference or deviation, no nystagmus   V: normal sensation of the face   VII: no facial asymmetry   VIII: normal hearing to speech   MOTOR: 5/5 in both upper and lower extremities   SENSORY: Normal to touch in all extremiteis   COORD: Normal finger to nose, heel to shin and  shoulder shrug, no tremor, no dysmetria. No pronator drift   Psychiatric:        Mood and Affect: Mood normal.        Behavior: Behavior normal.     ED Results / Procedures / Treatments   Labs (all labs ordered are listed, but only abnormal results are displayed) Labs Reviewed  BASIC METABOLIC PANEL - Abnormal; Notable for the following components:      Result Value   Sodium 133 (*)    Chloride 97 (*)    Glucose, Bld 155 (*)    All other components within normal limits  CBC  CBG MONITORING, ED  TROPONIN I (HIGH SENSITIVITY)    EKG EKG Interpretation Date/Time:  Thursday June 02 2023 11:55:15 EST Ventricular Rate:  65 PR Interval:  163 QRS Duration:  110 QT Interval:  436 QTC Calculation: 454 R Axis:   -17  Text Interpretation: Sinus rhythm Borderline left axis deviation RSR' in V1 or V2, probably normal variant Borderline T wave abnormalities Borderline ST elevation, anterolateral leads No significant change since last tracing Confirmed by Dreama Longs ([id number]) on 06/02/2023 12:45:36 PM  Radiology CT Head Wo Contrast Result Date: 06/02/2023 CLINICAL DATA:  Syncope/presyncope. Cerebrovascular cause suspected. EXAM: CT HEAD WITHOUT CONTRAST TECHNIQUE: Contiguous axial images were obtained from the base of the skull through the vertex without intravenous contrast. RADIATION DOSE REDUCTION: This exam was performed according to the departmental dose-optimization program which includes automated exposure control, adjustment of the mA and/or kV according to patient size and/or use of iterative reconstruction technique. COMPARISON:  None Available. FINDINGS: Brain: No acute infarct, hemorrhage, or mass lesion is present. No significant white matter lesions are present. Deep brain nuclei are within normal limits. The ventricles are of normal size. No significant extraaxial fluid collection is present. The brainstem and cerebellum are within normal limits. Midline structures are within  normal limits. Vascular: No hyperdense vessel or unexpected calcification. Skull: Calvarium is intact. No focal lytic or blastic lesions are present. No significant extracranial soft tissue lesion is present. Sinuses/Orbits: The paranasal sinuses and mastoid air cells are clear. The globes and orbits are within normal limits. IMPRESSION: Normal CT appearance of the brain. Electronically Signed   By: Lonni Necessary M.D.   On: 06/02/2023 14:58    Procedures Procedures    Medications Ordered in ED Medications  sodium chloride  0.9 % bolus 1,000 mL (1,000 mLs Intravenous New Bag/Given 06/02/23 1302)    ED Course/ Medical Decision Making/ A&P  Medical Decision Making Amount and/or Complexity of Data Reviewed Labs: ordered. Radiology: ordered.  This patient presents to the ED for concern of syncope, this involves an extensive number of treatment options, and is a complaint that carries with it a high risk of complications and morbidity. The differential for syncope is extensive and includes, but is not limited to: arrythmia (Vtach, SVT, SSS, sinus arrest, AV block, bradycardia) aortic stenosis, AMI, HOCM, PE, atrial myxoma, pulmonary hypertension, orthostatic hypotension, (hypovolemia, drug effect, GB syndrome, micturition, cough, swall) carotid sinus sensitivity, Seizure, TIA/CVA, hypoglycemia,  Vertigo.   My initial workup includes EKG, labs, CT head, IV fluids  Additional history obtained from: Nursing notes from this visit.  I ordered, reviewed and interpreted labs which include: CBC, BMP, troponin.  Hyponatremia of 133.  Hyperglycemia of 155.  I ordered imaging studies including CT head I independently visualized and interpreted imaging which showed negative I agree with the radiologist interpretation  Cardiac Monitoring:  The patient was maintained on a cardiac monitor.  I personally viewed and interpreted the cardiac monitored which showed an  underlying rhythm of: NSR  Afebrile, hemodynamically stable.  63 year old male presenting to the ED for evaluation of syncopal episode.  This is described as a vagal episode.  Has a history of same.  This occurred while he was giving blood earlier today.  He did have 2 episodes of vomiting so a CT of his head was obtained.  This was normal.  No history of head trauma.  He was given IV fluids in the emergency department.  He reports feeling back to his baseline.  Orthostatic vitals within normal limits.  No chest pain or shortness of breath.  Troponin negative.  EKG without acute ischemic changes.  Lower suspicion for cardiopulmonary causes of his syncope.  He was encouraged to follow-up with his primary care provider for radiographic evaluation.  He was given return precautions.  Stable at discharge.  At this time there does not appear to be any evidence of an acute emergency medical condition and the patient appears stable for discharge with appropriate outpatient follow up. Diagnosis was discussed with patient who verbalizes understanding of care plan and is agreeable to discharge. I have discussed return precautions with patient and mother who verbalizes understanding. Patient encouraged to follow-up with their PCP within 1 week. All questions answered.  Patient's case discussed with Dr. Dreama who agrees with plan to discharge with follow-up.   Note: Portions of this report may have been transcribed using voice recognition software. Every effort was made to ensure accuracy; however, inadvertent computerized transcription errors may still be present.         Final Clinical Impression(s) / ED Diagnoses Final diagnoses:  Syncope and collapse    Rx / DC Orders ED Discharge Orders     None         Edwardo Marsa HERO, DEVONNA 06/02/23 1534    Dreama Longs, MD 06/02/23 2324

## 2023-06-15 DIAGNOSIS — I10 Essential (primary) hypertension: Secondary | ICD-10-CM | POA: Diagnosis not present

## 2023-06-15 DIAGNOSIS — R7303 Prediabetes: Secondary | ICD-10-CM | POA: Diagnosis not present

## 2023-06-15 DIAGNOSIS — G40909 Epilepsy, unspecified, not intractable, without status epilepticus: Secondary | ICD-10-CM | POA: Diagnosis not present

## 2023-06-15 DIAGNOSIS — G4733 Obstructive sleep apnea (adult) (pediatric): Secondary | ICD-10-CM | POA: Diagnosis not present

## 2023-06-20 DIAGNOSIS — M25561 Pain in right knee: Secondary | ICD-10-CM | POA: Diagnosis not present

## 2023-06-20 DIAGNOSIS — M25461 Effusion, right knee: Secondary | ICD-10-CM | POA: Diagnosis not present

## 2023-07-03 DIAGNOSIS — G4733 Obstructive sleep apnea (adult) (pediatric): Secondary | ICD-10-CM | POA: Diagnosis not present

## 2023-07-28 DIAGNOSIS — G4733 Obstructive sleep apnea (adult) (pediatric): Secondary | ICD-10-CM | POA: Diagnosis not present

## 2023-07-31 DIAGNOSIS — G4733 Obstructive sleep apnea (adult) (pediatric): Secondary | ICD-10-CM | POA: Diagnosis not present

## 2023-09-30 DIAGNOSIS — G4733 Obstructive sleep apnea (adult) (pediatric): Secondary | ICD-10-CM | POA: Diagnosis not present

## 2023-10-05 DIAGNOSIS — Z8546 Personal history of malignant neoplasm of prostate: Secondary | ICD-10-CM | POA: Diagnosis not present

## 2023-10-12 DIAGNOSIS — Z8546 Personal history of malignant neoplasm of prostate: Secondary | ICD-10-CM | POA: Diagnosis not present

## 2023-10-31 DIAGNOSIS — G4733 Obstructive sleep apnea (adult) (pediatric): Secondary | ICD-10-CM | POA: Diagnosis not present

## 2023-11-29 DIAGNOSIS — J069 Acute upper respiratory infection, unspecified: Secondary | ICD-10-CM | POA: Diagnosis not present

## 2023-11-30 DIAGNOSIS — G4733 Obstructive sleep apnea (adult) (pediatric): Secondary | ICD-10-CM | POA: Diagnosis not present

## 2023-12-31 DIAGNOSIS — G4733 Obstructive sleep apnea (adult) (pediatric): Secondary | ICD-10-CM | POA: Diagnosis not present

## 2024-01-11 DIAGNOSIS — Z8546 Personal history of malignant neoplasm of prostate: Secondary | ICD-10-CM | POA: Diagnosis not present

## 2024-01-24 ENCOUNTER — Other Ambulatory Visit (HOSPITAL_COMMUNITY): Payer: Self-pay | Admitting: Urology

## 2024-01-24 DIAGNOSIS — Z8546 Personal history of malignant neoplasm of prostate: Secondary | ICD-10-CM

## 2024-01-24 DIAGNOSIS — R9721 Rising PSA following treatment for malignant neoplasm of prostate: Secondary | ICD-10-CM

## 2024-02-01 ENCOUNTER — Encounter (HOSPITAL_COMMUNITY)
Admission: RE | Admit: 2024-02-01 | Discharge: 2024-02-01 | Disposition: A | Discharge status: Home / Self Care | Source: Ambulatory Visit | Attending: Urology | Admitting: Urology

## 2024-02-01 DIAGNOSIS — Z8546 Personal history of malignant neoplasm of prostate: Secondary | ICD-10-CM | POA: Diagnosis not present

## 2024-02-01 DIAGNOSIS — C61 Malignant neoplasm of prostate: Secondary | ICD-10-CM | POA: Diagnosis not present

## 2024-02-01 DIAGNOSIS — R9721 Rising PSA following treatment for malignant neoplasm of prostate: Secondary | ICD-10-CM | POA: Diagnosis not present

## 2024-02-01 MED ORDER — FLOTUFOLASTAT F 18 GALLIUM 296-5846 MBQ/ML IV SOLN
7.2000 | Freq: Once | INTRAVENOUS | Status: AC
Start: 2024-02-01 — End: 2024-02-01
  Administered 2024-02-01: 7.2 via INTRAVENOUS

## 2024-03-29 DIAGNOSIS — Z23 Encounter for immunization: Secondary | ICD-10-CM | POA: Diagnosis not present

## 2024-03-29 DIAGNOSIS — I10 Essential (primary) hypertension: Secondary | ICD-10-CM | POA: Diagnosis not present

## 2024-03-29 DIAGNOSIS — E7800 Pure hypercholesterolemia, unspecified: Secondary | ICD-10-CM | POA: Diagnosis not present

## 2024-03-29 DIAGNOSIS — R7303 Prediabetes: Secondary | ICD-10-CM | POA: Diagnosis not present

## 2024-03-29 DIAGNOSIS — Z0000 Encounter for general adult medical examination without abnormal findings: Secondary | ICD-10-CM | POA: Diagnosis not present

## 2024-04-11 DIAGNOSIS — Z8546 Personal history of malignant neoplasm of prostate: Secondary | ICD-10-CM | POA: Diagnosis not present

## 2024-04-18 DIAGNOSIS — R9721 Rising PSA following treatment for malignant neoplasm of prostate: Secondary | ICD-10-CM | POA: Diagnosis not present

## 2024-04-18 DIAGNOSIS — C61 Malignant neoplasm of prostate: Secondary | ICD-10-CM | POA: Diagnosis not present

## 2024-05-18 DIAGNOSIS — M10072 Idiopathic gout, left ankle and foot: Secondary | ICD-10-CM | POA: Diagnosis not present
